# Patient Record
Sex: Female | Born: 1965
Health system: Southern US, Community
[De-identification: ages and names within clinical notes are randomized; demographics above are authoritative.]

## PROBLEM LIST (undated history)

## (undated) DIAGNOSIS — K219 Gastro-esophageal reflux disease without esophagitis: Secondary | ICD-10-CM

## (undated) DIAGNOSIS — B009 Herpesviral infection, unspecified: Secondary | ICD-10-CM

## (undated) DIAGNOSIS — M549 Dorsalgia, unspecified: Secondary | ICD-10-CM

## (undated) DIAGNOSIS — G43909 Migraine, unspecified, not intractable, without status migrainosus: Secondary | ICD-10-CM

## (undated) DIAGNOSIS — E7800 Pure hypercholesterolemia, unspecified: Secondary | ICD-10-CM

## (undated) DIAGNOSIS — E0500 Thyrotoxicosis with diffuse goiter without thyrotoxic crisis or storm: Secondary | ICD-10-CM

## (undated) DIAGNOSIS — K760 Fatty (change of) liver, not elsewhere classified: Secondary | ICD-10-CM

## (undated) DIAGNOSIS — R87619 Unspecified abnormal cytological findings in specimens from cervix uteri: Secondary | ICD-10-CM

## (undated) HISTORY — DX: Migraine, unspecified, not intractable, without status migrainosus: G43.909

## (undated) HISTORY — DX: Dorsalgia, unspecified: M54.9

## (undated) HISTORY — DX: Fatty (change of) liver, not elsewhere classified: K76.0

## (undated) HISTORY — DX: Gastro-esophageal reflux disease without esophagitis: K21.9

## (undated) HISTORY — PX: TUBAL LIGATION: SHX77

## (undated) HISTORY — DX: Herpesviral infection, unspecified: B00.9

## (undated) HISTORY — DX: Pure hypercholesterolemia, unspecified: E78.00

## (undated) HISTORY — PX: AUGMENTATION MAMMAPLASTY: SUR837

## (undated) HISTORY — PX: EYE SURGERY: SHX253

## (undated) HISTORY — DX: Unspecified abnormal cytological findings in specimens from cervix uteri: R87.619

## (undated) HISTORY — DX: Thyrotoxicosis with diffuse goiter without thyrotoxic crisis or storm: E05.00

---

## 1997-07-31 ENCOUNTER — Other Ambulatory Visit: Admission: RE | Admit: 1997-07-31 | Discharge: 1997-07-31 | Payer: Self-pay | Admitting: *Deleted

## 1998-03-04 ENCOUNTER — Other Ambulatory Visit: Admission: RE | Admit: 1998-03-04 | Discharge: 1998-03-04 | Payer: Self-pay | Admitting: *Deleted

## 1998-03-17 ENCOUNTER — Ambulatory Visit (HOSPITAL_COMMUNITY): Admission: RE | Admit: 1998-03-17 | Discharge: 1998-03-17 | Payer: Self-pay | Admitting: *Deleted

## 1998-03-17 ENCOUNTER — Encounter: Payer: Self-pay | Admitting: *Deleted

## 1998-04-07 ENCOUNTER — Encounter (HOSPITAL_COMMUNITY): Admission: RE | Admit: 1998-04-07 | Discharge: 1998-07-06 | Payer: Self-pay | Admitting: *Deleted

## 1998-04-16 ENCOUNTER — Inpatient Hospital Stay (HOSPITAL_COMMUNITY): Admission: AD | Admit: 1998-04-16 | Discharge: 1998-04-16 | Payer: Self-pay | Admitting: Obstetrics & Gynecology

## 1998-05-18 ENCOUNTER — Inpatient Hospital Stay (HOSPITAL_COMMUNITY): Admission: AD | Admit: 1998-05-18 | Discharge: 1998-05-18 | Payer: Self-pay | Admitting: Obstetrics

## 1998-06-30 ENCOUNTER — Inpatient Hospital Stay (HOSPITAL_COMMUNITY): Admission: AD | Admit: 1998-06-30 | Discharge: 1998-07-03 | Payer: Self-pay | Admitting: *Deleted

## 1998-07-07 ENCOUNTER — Encounter (HOSPITAL_COMMUNITY): Admission: RE | Admit: 1998-07-07 | Discharge: 1998-09-25 | Payer: Self-pay | Admitting: *Deleted

## 1998-07-19 ENCOUNTER — Inpatient Hospital Stay (HOSPITAL_COMMUNITY): Admission: AD | Admit: 1998-07-19 | Discharge: 1998-07-19 | Payer: Self-pay | Admitting: Obstetrics & Gynecology

## 1998-07-31 ENCOUNTER — Inpatient Hospital Stay (HOSPITAL_COMMUNITY): Admission: AD | Admit: 1998-07-31 | Discharge: 1998-08-04 | Payer: Self-pay | Admitting: *Deleted

## 1998-09-24 ENCOUNTER — Inpatient Hospital Stay (HOSPITAL_COMMUNITY): Admission: AD | Admit: 1998-09-24 | Discharge: 1998-09-27 | Payer: Self-pay | Admitting: *Deleted

## 1998-09-24 ENCOUNTER — Encounter (INDEPENDENT_AMBULATORY_CARE_PROVIDER_SITE_OTHER): Payer: Self-pay | Admitting: Specialist

## 1998-09-28 ENCOUNTER — Encounter (HOSPITAL_COMMUNITY): Admission: RE | Admit: 1998-09-28 | Discharge: 1998-12-27 | Payer: Self-pay | Admitting: *Deleted

## 1998-11-10 ENCOUNTER — Encounter (INDEPENDENT_AMBULATORY_CARE_PROVIDER_SITE_OTHER): Payer: Self-pay | Admitting: Specialist

## 1998-11-10 ENCOUNTER — Inpatient Hospital Stay (HOSPITAL_COMMUNITY): Admission: AD | Admit: 1998-11-10 | Discharge: 1998-11-10 | Payer: Self-pay | Admitting: *Deleted

## 1999-05-25 ENCOUNTER — Other Ambulatory Visit: Admission: RE | Admit: 1999-05-25 | Discharge: 1999-05-25 | Payer: Self-pay | Admitting: Obstetrics & Gynecology

## 2000-06-07 ENCOUNTER — Other Ambulatory Visit: Admission: RE | Admit: 2000-06-07 | Discharge: 2000-06-07 | Payer: Self-pay | Admitting: Obstetrics & Gynecology

## 2001-07-13 ENCOUNTER — Other Ambulatory Visit: Admission: RE | Admit: 2001-07-13 | Discharge: 2001-07-13 | Payer: Self-pay | Admitting: Obstetrics & Gynecology

## 2002-07-24 ENCOUNTER — Other Ambulatory Visit: Admission: RE | Admit: 2002-07-24 | Discharge: 2002-07-24 | Payer: Self-pay | Admitting: Obstetrics & Gynecology

## 2003-03-21 ENCOUNTER — Emergency Department (HOSPITAL_COMMUNITY): Admission: EM | Admit: 2003-03-21 | Discharge: 2003-03-21 | Payer: Self-pay | Admitting: Emergency Medicine

## 2003-03-21 ENCOUNTER — Emergency Department (HOSPITAL_COMMUNITY): Admission: EM | Admit: 2003-03-21 | Discharge: 2003-03-21 | Payer: Self-pay | Admitting: Internal Medicine

## 2008-11-05 ENCOUNTER — Emergency Department (HOSPITAL_COMMUNITY): Admission: EM | Admit: 2008-11-05 | Discharge: 2008-11-05 | Payer: Self-pay | Admitting: Emergency Medicine

## 2012-05-11 ENCOUNTER — Ambulatory Visit: Payer: BC Managed Care – PPO

## 2012-05-14 ENCOUNTER — Ambulatory Visit (INDEPENDENT_AMBULATORY_CARE_PROVIDER_SITE_OTHER): Payer: BC Managed Care – PPO | Admitting: Family Medicine

## 2012-05-14 ENCOUNTER — Encounter: Payer: Self-pay | Admitting: Radiology

## 2012-05-14 VITALS — BP 114/78 | HR 64 | Temp 98.0°F | Resp 16 | Ht 62.25 in | Wt 146.0 lb

## 2012-05-14 DIAGNOSIS — Z23 Encounter for immunization: Secondary | ICD-10-CM

## 2012-05-14 DIAGNOSIS — Z0000 Encounter for general adult medical examination without abnormal findings: Secondary | ICD-10-CM

## 2012-05-14 DIAGNOSIS — Z7189 Other specified counseling: Secondary | ICD-10-CM

## 2012-05-14 DIAGNOSIS — G43909 Migraine, unspecified, not intractable, without status migrainosus: Secondary | ICD-10-CM

## 2012-05-14 NOTE — Progress Notes (Signed)
  Subjective:    Patient ID: Andrea Walsh, female    DOB: 1965-07-05, 47 y.o.   MRN: [id number]  HPI    Review of Systems     Objective:   Physical Exam     Tuberculosis Risk Questionnaire  1. Were you born outside the [place] in one of the following parts of the world:    [place], [place], [place], [place] or [place]?  No  2. Have you traveled outside the [place] and lived for more than one month in one of the following parts of the world:  [place], [place], [place], [place] or [place]?  No  3. Do you have a compromised immune system such as from any of the following conditions:  HIV/AIDS, organ or bone marrow transplantation, diabetes, immunosuppressive   medicines (e.g. Prednisone, Remicaide), leukemia, lymphoma, cancer of the   head or neck, gastrectomy or jejunal bypass, end-stage renal disease (on   dialysis), or silicosis?  No    4. Have you ever done one of the following:    Used crack cocaine, injected illegal drugs, worked or resided in jail or prison,   worked or resided at a homeless shelter, or worked as a Research scientist (physical sciences) in   direct contact with patients?  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  5. Have you ever been exposed to anyone with infectious tuberculosis?  No   Tuberculosis Symptom Questionnaire  Do you currently have any of the following symptoms?  1. Unexplained cough lasting more than 3 weeks? No  Unexplained fever lasting more than 3 weeks. No   3. Night Sweats (sweating that leaves the bedclothes and sheets wet)   No  4. Shortness of Breath No  5. Chest Pain No  6. Unintentional weight loss  No  7. Unexplained fatigue (very tired for no reason) No     Assessment & Plan:

## 2012-05-14 NOTE — Progress Notes (Addendum)
[facility] [address], [place] [address] [phone]  Date:  05/14/2012   Name:  Andrea Walsh   DOB:  07/02/1965   MRN:  [id number]  PCP:  No primary provider on file.    Chief Complaint: Annual Exam   History of Present Illness:  Andrea Walsh is a 47 y.o. 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 She would like to use this visit as her annual exam, but she does have an OB- GYN and declines additional BW, breast or pelvic exams today.  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    She has no known medical problems except for migraine HA which she might get twice a month.  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  She has 5 children (one set of twins) and is SA with one partner.  She does not smoke or drink or use any drugs, and exercises regularly.    She had all of her childhood immunizations but does not have a record of these.  She does not think she has had the hep B series, and has not had a recent tetanus shot. She did have a flu shot (no record with her at this time) and [facility]  There is no problem list on file for this patient.   No past medical history on file.  Past Surgical History  Procedure Laterality Date  . Tubal ligation      History  Substance Use Topics  . Smoking status: Never Smoker   . Smokeless tobacco: Not on file  . Alcohol Use: No    Family History  Problem Relation Age of Onset  . Cancer Father     lung    Allergies  Allergen Reactions  . Morphine And Related Nausea And Vomiting  . Pyridium (Phenazopyridine Hcl) Rash    Medication list has been reviewed and updated.  No current outpatient prescriptions on  file prior to visit.   No current facility-administered medications on file prior to visit.    Review of Systems:  As per HPI- otherwise negative.   Physical Examination: Filed Vitals:   05/14/12 1236  BP: 114/78  Pulse: 64  Temp: 98 F (36.7 C)  Resp: 16   Filed Vitals:   05/14/12 1236  Height: 5' 2.25" (1.581 m)  Weight: 146 lb (66.225 kg)   Body mass index is 26.49 kg/(m^2). Ideal Body Weight: Weight in (lb) to have BMI = 25: 137.5  GEN: WDWN, NAD, Non-toxic, A & O x 3 HEENT: Atraumatic, Normocephalic. Neck supple. No masses, No LAD.  Bilateral TM wnl, oropharynx normal.  Right pupil is asymmetrical (present since infancy per her report),EOMI.   Ears and Nose: No external deformity. CV: RRR, No M/G/R. No JVD. No thrill. No extra heart sounds. PULM: CTA B, no wheezes, crackles, rhonchi. No retractions. No resp. distress. No accessory muscle use. ABD: S, NT, ND, +BS. No rebound. No HSM. EXTR: No c/c/e NEURO Normal gait. Normal strength and DTR extremities  PSYCH: Normally interactive. Conversant. Not depressed or anxious appearing.  Calm demeanor.    Assessment and Plan: Immunization counseling -  Plan: Tdap vaccine greater than or equal to 7yo IM, Hepatitis B vaccine adult IM, Measles/Mumps/Rubella Immunity, Varicella zoster antibody, IgG, TB Skin Test  Physical exam, annual  Migraine headache  Completed PE form for her school.  Her vision is ok despite her pupil issue.  Gave Tdap, Hep B #1 and placed PPD today, drew titers as above.  She will come back in 48- 72 hours for her TB read and can get a copy of her titers at that time.    Signed Abbe Amsterdam, MD  4/22- called and LMOM.  unfortunately she is not immune to measles.  She needs to have an MMR booster when she comes in for her PPD read.  If I am not there personally suggested that she ask for a PA to review her chart to facilitate her getting this immunization. Otherwise her titers are positive.

## 2012-05-15 LAB — MEASLES/MUMPS/RUBELLA IMMUNITY
Mumps IgG: 65.6 AU/mL — ABNORMAL HIGH (ref <9.00)
Rubella: 3.87 Index — ABNORMAL HIGH (ref <0.90)
Rubeola IgG: <5 AU/mL (ref <25.00)

## 2012-05-15 LAB — VARICELLA ZOSTER ANTIBODY, IGG: Varicella IgG: 1028 Index — ABNORMAL HIGH (ref <135.00)

## 2012-05-16 ENCOUNTER — Ambulatory Visit (INDEPENDENT_AMBULATORY_CARE_PROVIDER_SITE_OTHER): Payer: BC Managed Care – PPO | Admitting: Physician Assistant

## 2012-05-16 VITALS — BP 110/68 | HR 66 | Temp 98.2°F | Resp 16 | Ht 62.25 in | Wt 146.0 lb

## 2012-05-16 DIAGNOSIS — Z23 Encounter for immunization: Secondary | ICD-10-CM

## 2012-05-16 LAB — TB SKIN TEST: TB Skin Test: NEGATIVE

## 2012-05-16 NOTE — Progress Notes (Signed)
  Subjective:    Patient ID: Andrea Walsh, female    DOB: 1965/11/22, 47 y.o.   MRN: [id number]  HPI   Andrea Walsh is a very pleasant 47 yr old female here to complete immunization requirements for school.   She was here two days ago for CPE, see previous note.  At that time a PPD was placed, she needs that read today.  Additionally she had MMR and varicella titers drawn.  Lab results show that she is immune to varicella, mumps, and rubella.  She is NOT immune to measles.  School requires either two documented MMR vaccines or proof of immunity.  Pt knows she received the vaccines as a child but has no documentation.    Review of Systems  All other systems reviewed and are negative.       Objective:   Physical Exam  Vitals reviewed. Constitutional: She is oriented to person, place, and time. She appears well-developed and well-nourished. No distress.  HENT:  Head: Normocephalic and atraumatic.  Eyes: Conjunctivae are normal. No scleral icterus.  Pulmonary/Chest: Effort normal.  Neurological: She is alert and oriented to person, place, and time.  Skin: Skin is warm and dry.  Psychiatric: She has a normal mood and affect. Her behavior is normal.     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   PPD negative.  She is immune to mumps, rubella, and varicella.  She is NOT immune to measles.  She does not have any documented MMR vaccine administrations.  Since her program requires either two MMR administrations or documented immunity, I think her only option is to repeat the MMR vaccines.  First dose given today.  Future order placed to receive second dose in 1 month.  Pt will also be returning then for 2nd hep b.   Paperwork updated today.

## 2012-06-13 ENCOUNTER — Ambulatory Visit (INDEPENDENT_AMBULATORY_CARE_PROVIDER_SITE_OTHER): Payer: BC Managed Care – PPO | Admitting: Physician Assistant

## 2012-06-13 VITALS — BP 100/72 | HR 60 | Temp 98.3°F | Resp 16 | Ht 62.0 in | Wt 144.6 lb

## 2012-06-13 DIAGNOSIS — Z23 Encounter for immunization: Secondary | ICD-10-CM

## 2012-06-13 NOTE — Progress Notes (Signed)
  Subjective:    Patient ID: Andrea Walsh, female    DOB: 02-11-1965, 47 y.o.   MRN: [id number]  HPI 47 year old female presents for immunizations.  Needs Hep B #2 and MMMR #2.  No other concerns today.     Review of Systems  Constitutional: Negative for fever and chills.  Gastrointestinal: Negative for nausea and vomiting.  Neurological: Negative for headaches.       Objective:   Physical Exam  Constitutional: She is oriented to person, place, and time. She appears well-developed and well-nourished.  HENT:  Head: Normocephalic and atraumatic.  Right Ear: External ear normal.  Left Ear: External ear normal.  Eyes: Conjunctivae are normal.  Neck: Normal range of motion.  Cardiovascular: Normal rate.   Pulmonary/Chest: Effort normal.  Neurological: She is alert and oriented to person, place, and time.  Psychiatric: She has a normal mood and affect. Her behavior is normal. Judgment and thought content normal.          Assessment & Plan:  Need for prophylactic vaccination and inoculation against viral hepatitis - Plan: Hepatitis B vaccine adult IM  Need for MMR vaccine  Need for immunization against measles - Plan: MMR vaccine subcutaneous Hep B #2 and MMR #2 given today Return for Hep B #3

## 2012-11-13 ENCOUNTER — Ambulatory Visit (INDEPENDENT_AMBULATORY_CARE_PROVIDER_SITE_OTHER): Payer: BC Managed Care – PPO | Admitting: Radiology

## 2012-11-13 DIAGNOSIS — Z23 Encounter for immunization: Secondary | ICD-10-CM

## 2012-11-13 NOTE — Addendum Note (Signed)
Addended byLevon Hedger A on: 11/13/2012 02:27 PM   Modules accepted: Level of Service

## 2014-11-29 ENCOUNTER — Encounter (HOSPITAL_COMMUNITY): Payer: Self-pay

## 2014-11-29 ENCOUNTER — Emergency Department (HOSPITAL_COMMUNITY)
Admission: EM | Admit: 2014-11-29 | Discharge: 2014-11-29 | Disposition: A | Discharge status: Home / Self Care | Payer: 59 | Attending: Emergency Medicine | Admitting: Emergency Medicine

## 2014-11-29 DIAGNOSIS — M545 Low back pain, unspecified: Secondary | ICD-10-CM

## 2014-11-29 DIAGNOSIS — Z79899 Other long term (current) drug therapy: Secondary | ICD-10-CM | POA: Diagnosis not present

## 2014-11-29 DIAGNOSIS — Z7951 Long term (current) use of inhaled steroids: Secondary | ICD-10-CM | POA: Diagnosis not present

## 2014-11-29 MED ORDER — HYDROCODONE-ACETAMINOPHEN 5-325 MG PO TABS: 2.0000 | ORAL_TABLET | ORAL | Status: DC | PRN

## 2014-11-29 MED ORDER — METHOCARBAMOL 500 MG PO TABS: 500.0000 mg | ORAL_TABLET | Freq: Two times a day (BID) | ORAL | Status: DC | PRN

## 2014-11-29 MED ORDER — METHOCARBAMOL 500 MG PO TABS
500.0000 mg | ORAL_TABLET | Freq: Once | ORAL | Status: AC
  Administered 2014-11-29: 500 mg via ORAL
  Filled 2014-11-29: qty 1

## 2014-11-29 MED ORDER — KETOROLAC TROMETHAMINE 60 MG/2ML IM SOLN
60.0000 mg | Freq: Once | INTRAMUSCULAR | Status: AC
  Administered 2014-11-29: 60 mg via INTRAMUSCULAR
  Filled 2014-11-29: qty 2

## 2014-11-29 MED ORDER — IBUPROFEN 800 MG PO TABS: 800.0000 mg | ORAL_TABLET | Freq: Three times a day (TID) | ORAL | Status: DC

## 2014-11-29 NOTE — ED Notes (Signed)
She c/o "incapacitating" low back pain/spasm. She cites "working out at [employer]" this Wed.  She is in no distress and is a bit tearful as I interview her.  She denies any paresthesias/dysthesias of lower extremities.

## 2014-11-29 NOTE — ED Provider Notes (Signed)
CSN: [id number]     Arrival date & time 11/29/14  8416 History   First MD Initiated Contact with Patient 11/29/14 [phone]     Chief Complaint  Patient presents with  . Back Pain     (Consider location/radiation/quality/duration/timing/severity/associated sxs/prior Treatment) HPI  The patient is a 49 year old female, she works as an Hotel manager, states that once a year she has a flare up of her back pain - states that on Wednesday after working out particularly hard, she had R lower back pain that has been persistent, worse with movement and does not radiate - no red flags for pathological back pain.  She has increased pain since last night and she feels as though the pain is spasming.  History reviewed. No pertinent past medical history. Past Surgical History  Procedure Laterality Date  . Tubal ligation     Family History  Problem Relation Age of Onset  . Cancer Father     lung   Social History  Substance Use Topics  . Smoking status: Never Smoker   . Smokeless tobacco: None  . Alcohol Use: No   OB History    No data available     Review of Systems  Constitutional: Negative for fever and chills.  Cardiovascular: Negative for leg swelling.  Gastrointestinal: Negative for nausea and vomiting.       No incontinence of bowel  Genitourinary: Negative for difficulty urinating.       No incontinence or retention  Musculoskeletal: Positive for back pain. Negative for neck pain.  Skin: Negative for rash.  Neurological: Negative for weakness and numbness.      Allergies  Morphine and related and Pyridium  Home Medications   Prior to Admission medications   Medication Sig Start Date End Date Taking? Authorizing Provider  fluticasone (FLONASE) 50 MCG/ACT nasal spray Place 2 sprays into the nose daily.    Historical Provider, MD  HYDROcodone-acetaminophen (NORCO/VICODIN) 5-325 MG tablet Take 2 tablets by mouth every 4 (four) hours as needed. 11/29/14   Noemi Chapel, MD   ibuprofen (ADVIL,MOTRIN) 800 MG tablet Take 1 tablet (800 mg total) by mouth 3 (three) times daily. 11/29/14   Noemi Chapel, MD  methocarbamol (ROBAXIN) 500 MG tablet Take 1 tablet (500 mg total) by mouth 2 (two) times daily as needed for muscle spasms. 11/29/14   Noemi Chapel, MD  Multiple Vitamin (MULTIVITAMIN) tablet Take 1 tablet by mouth daily.    Historical Provider, MD  zolmitriptan (ZOMIG) 5 MG tablet Take 5 mg by mouth as needed for migraine.    Historical Provider, MD   BP 165/99 mmHg  Pulse 101  Temp(Src) 97.6 F (36.4 C) (Oral)  Resp 17  SpO2 99%  LMP 11/08/2014 (Approximate) Physical Exam  Constitutional: She appears well-developed and well-nourished. No distress.  HENT:  Head: Normocephalic and atraumatic.  Eyes: Conjunctivae are normal. Right eye exhibits no discharge. Left eye exhibits no discharge. No scleral icterus.  Cardiovascular: Normal rate and regular rhythm.   Pulmonary/Chest: Effort normal and breath sounds normal.  Musculoskeletal: She exhibits no edema.  Tenderness of the back over the R lateral lumba]r muscles No tenderness over the Cervical, Thoracic or Lumbar Spine  Neurological:  Speech is clear, strength in the UE and LE's are normal at the major muscle groups including the hip, knee and ankles.  Sensation in tact to light touch and pin prick of the bilateral LE's.  Normal reflexes at the knees bilaterally.  Gait antalgic secondary to pain  Skin: Skin is warm and dry. No rash noted. She is not diaphoretic.    ED Course  Procedures (including critical care time) Labs Review Labs Reviewed - No data to display  Imaging Review No results found. I have personally reviewed and evaluated these images and lab results as part of my medical decision-making.   MDM   Final diagnoses:  Right-sided low back pain without sciatica    The patient is having ongoing back pain, this seems to be worse with movement, consistent with muscle spasm and strain of  the right lower back, Toradol, Robaxin, close follow-up, the patient is in agreement with the plan.  No fevers, IVDU, Ca, urinary sx.  Meds given in ED:  Medications  ketorolac (TORADOL) injection 60 mg (not administered)  methocarbamol (ROBAXIN) tablet 500 mg (not administered)    New Prescriptions   HYDROCODONE-ACETAMINOPHEN (NORCO/VICODIN) 5-325 MG TABLET    Take 2 tablets by mouth every 4 (four) hours as needed.   IBUPROFEN (ADVIL,MOTRIN) 800 MG TABLET    Take 1 tablet (800 mg total) by mouth 3 (three) times daily.   METHOCARBAMOL (ROBAXIN) 500 MG TABLET    Take 1 tablet (500 mg total) by mouth 2 (two) times daily as needed for muscle spasms.        Noemi Chapel, MD 11/29/14 [phone]

## 2014-11-29 NOTE — Discharge Instructions (Signed)
Back Pain: ° ° °Your back pain should be treated with medicines such as ibuprofen or aleve and this back pain should get better over the next 2 weeks.  However if you develop severe or worsening pain, low back pain with fever, numbness, weakness or inability to walk or urinate, you should return to the ER immediately.  Please follow up with your doctor this week for a recheck if still having symptoms. °Low back pain is discomfort in the lower back that may be due to injuries to muscles and ligaments around the spine.  Occasionally, it may be caused by a a problem to a part of the spine called a disc.  The pain may last several days or a week;  However, most patients get completely well in 4 weeks. ° °Self - care:  The application of heat can help soothe the pain.  Maintaining your daily activities, including walking, is encourged, as it will help you get better faster than just staying in bed. ° °Medications are also useful to help with pain control.  A commonly prescribed medications includes acetaminophen.  This medication is generally safe, though you should not take more than 8 of the extra strength (500mg) pills a day. ° °Non steroidal anti inflammatory medications including Ibuprofen and naproxen;  These medications help both pain and swelling and are very useful in treating back pain.  They should be taken with food, as they can cause stomach upset, and more seriously, stomach bleeding.   ° °Muscle relaxants:  These medications can help with muscle tightness that is a cause of lower back pain.  Most of these medications can cause drowsiness, and it is not safe to drive or use dangerous machinery while taking them. ° °You will need to follow up with  Your primary healthcare provider in 1-2 weeks for reassessment. ° °Be aware that if you develop new symptoms, such as a fever, leg weakness, difficulty with or loss of control of your urine or bowels, abdominal pain, or more severe pain, you will need to seek  medical attention and  / or return to the Emergency department. ° °If you do not have a doctor see the list below. ° °RESOURCE GUIDE ° °Chronic Pain Problems: °Contact [facility]  [phone] °Patients need to be referred by their primary care doctor. ° °Insufficient Money for Medicine: °Contact United Way:  call "211" or [facility] 271-5999. ° °No Primary Care Doctor: °- Call [facility]  832-8000 - can help you locate a primary care doctor that  accepts your insurance, provides certain services, etc. °- Physician Referral Service- 1-800-533-3463 ° °Agencies that provide inexpensive medical care: °- [facility]  [phone] °- [facility]  832-7272 °- [facility]  [phone] °- [facility]  832-4777 °- [facility]  [phone] °- [facility]  [phone] ° °Medicaid-accepting [place] Providers: °- [facility]- [address] ° [phone], Mon-Fri 9am-7pm, Sat 9am-1pm °- [facility]- [address], [address] ° [phone] °- [facility]- [address], [address] ° [phone] °- [facility]- [address] [place] [address] ° [phone] °- [facility]- [address], [address], [phone] ° Only accepts [place] Access Medicaid patients after they have their name  applied to their card ° °Self Pay (no insurance) in [place]: °- Sickle Cell Patients: Dr Eric Dean, [facility] °   [address], [phone] °- [facility]- [address] ° [phone] °      -     [facility]- [address] °      -     [facility]- see information above (Speak to Pam H if you do not have insurance) °      -  [facility]- [address], [phone] °      -  [facility] [place]- [address],  [phone] °      -  [facility]- [address] [place] [address],  [phone] °      -  Dr Osei-Bonsu-  [address], [place], [phone] °      -  [facility]- [address], [phone] °      -  [facility]- [address] [place] [address], [phone], also [address], [phone] °      -    [facility]- [address], [phone], 1st & 3rd Saturday   every month, 10am-1pm ° °1) Find a Doctor and Pay Out of Pocket °Although you won't have to find out who is covered by your insurance plan, it is a good idea to ask around and get recommendations. You will then need to call the office and see if the doctor you have chosen will accept you as a new patient and what types of options they offer for patients who are self-pay. Some doctors offer discounts or will set up payment plans for their patients who do not have insurance, but you will need to ask so you aren't surprised when you get to your appointment. ° °2) Contact Your Local Health Department °Not all health departments have doctors that can see patients for sick visits, but many do, so it is worth a call to see if yours does. If you don't know where your local health department is, you can check in your phone book. The CDC also has a tool to help you locate your state's health department, and many state websites also have listings of all of their local health departments. ° °3) Find a Walk-in Clinic °If your illness is not likely to be very severe or complicated, you may want to try a walk in clinic. These are popping up all over the country in pharmacies, drugstores, and shopping centers. They're usually staffed by nurse practitioners or physician assistants that have been trained to treat common illnesses and complaints. They're usually fairly quick and inexpensive. However, if you have serious medical issues or chronic medical problems, these are probably not your best option ° °STD Testing °- [place] [facility] [place], [facility] Clinic, [address], [place], phone [phone] or 1-877-539-9860.  Monday - Friday, call for an appointment. °- [place] [facility] [place], [facility], [address], [place], phone [phone] or 1-877-539-9860.  Monday - Friday, call for an appointment. ° °Abuse/Neglect: °- [place] Child Abuse Hotline (336) 641-3795 °- [place] Child Abuse Hotline 800-378-5315 (After Hours) ° °Emergency Shelter:  [facility] (336) 271-5985 ° °Maternity Homes: °- [facility] (336) 275-9566 °- [facility] (704) 372-4663 ° °MRSA Hotline #:   832-7006 ° °[place] Resources ° °[facility] [place]  [employer] [place] [facility] °[address]                 [address]         [address]  °[place]                                               [place]                              [place] °Phone:  349-3220                                  Phone:  342-7768                   Phone:  342-8140 ° °[facility], 342-8316 °- [place] [facility] - [facility]- 1-888-581-9988 °      -     [facility] in [place], [address],                                  336-349-4454, Insurance ° °[place] Child Abuse Hotline °(336) 342-1394 or (336) 342-3537 (After Hours) ° ° °Behavioral Health Services ° °Substance Abuse Resources: °- [facility]  336-882-2125 °- [facility] 336-784-9470 °- [facility] 336-285-9073 °- [facility] 336-845-3988 °- Residential & Outpatient Substance Abuse Program  800-659-3381 ° °Psychological Services: °- [facility]  832-9600 °- [facility]  378-7881 °- [facility], [address], [place], ACCESS LINE: 1-800-853-5163 or 336-641-4981, Http://www.[facility]/services/adult.htm ° °Dental Assistance ° °If unable to pay or  uninsured, contact:  [facility] or [place] [facility] to become qualified for the adult dental clinic. ° °Patients with Medicaid: [facility] °[address], [phone] °[address], [phone] ° °If unable to pay, or uninsured, contact [facility] (271-5999) or [place] [facility] (641-3152 in [place], 842-7733 in [place]) to become qualified for the adult dental clinic ° °Other Low-Cost Community Dental Services: °- [facility]- [address], [place], [place], [address], 723-1848, [address]. [address], 2nd and 4th Thursday of the month at 6:30am.  10 clients each day by appointment, can sometimes see walk-in patients if someone does not show for an appointment. °- [facility]- [address], [place], [place], [address], 723-7904 °- [facility]- [address], [place], [place], [address], 631-2330 °- [facility] [place] [facility]- 342-8273 °- [facility]- 703-3100 °- [facility]- 570-6415 ° ° ° ° ° ° °

## 2015-02-11 MED FILL — ZOMIG 5 MG NASAL SPRAY: 5 | 90 days supply | Qty: 24 | Fill #1

## 2015-05-12 DIAGNOSIS — R232 Flushing: Secondary | ICD-10-CM | POA: Diagnosis not present

## 2015-05-12 DIAGNOSIS — R21 Rash and other nonspecific skin eruption: Secondary | ICD-10-CM | POA: Diagnosis not present

## 2015-05-12 MED FILL — predniSONE 10 MG (48) TBPK: 10 | 12 days supply | Qty: 48 | Fill #0

## 2015-05-26 DIAGNOSIS — E0590 Thyrotoxicosis, unspecified without thyrotoxic crisis or storm: Secondary | ICD-10-CM | POA: Diagnosis not present

## 2015-05-27 ENCOUNTER — Other Ambulatory Visit (HOSPITAL_COMMUNITY): Payer: Self-pay | Admitting: Family Medicine

## 2015-05-27 DIAGNOSIS — E0590 Thyrotoxicosis, unspecified without thyrotoxic crisis or storm: Secondary | ICD-10-CM

## 2015-06-04 ENCOUNTER — Encounter (HOSPITAL_COMMUNITY)
Admission: RE | Admit: 2015-06-04 | Discharge: 2015-06-04 | Disposition: A | Discharge status: Home / Self Care | Payer: 59 | Source: Ambulatory Visit | Attending: Family Medicine | Admitting: Family Medicine

## 2015-06-04 DIAGNOSIS — E0590 Thyrotoxicosis, unspecified without thyrotoxic crisis or storm: Secondary | ICD-10-CM | POA: Insufficient documentation

## 2015-06-05 ENCOUNTER — Encounter (HOSPITAL_COMMUNITY)
Admission: RE | Admit: 2015-06-05 | Discharge: 2015-06-05 | Disposition: A | Discharge status: Home / Self Care | Payer: 59 | Source: Ambulatory Visit | Attending: Family Medicine | Admitting: Family Medicine

## 2015-06-05 DIAGNOSIS — E0590 Thyrotoxicosis, unspecified without thyrotoxic crisis or storm: Secondary | ICD-10-CM | POA: Diagnosis not present

## 2015-06-05 MED ORDER — SODIUM PERTECHNETATE TC 99M INJECTION
10.0000 | Freq: Once | INTRAVENOUS | Status: AC | PRN
  Administered 2015-06-05: 10 via INTRAVENOUS

## 2015-06-05 MED ORDER — SODIUM IODIDE I 131 CAPSULE
10.0000 | Freq: Once | INTRAVENOUS | Status: AC | PRN
  Administered 2015-06-04: 10 via ORAL

## 2015-06-24 DIAGNOSIS — E0590 Thyrotoxicosis, unspecified without thyrotoxic crisis or storm: Secondary | ICD-10-CM | POA: Diagnosis not present

## 2015-06-29 DIAGNOSIS — Z6828 Body mass index (BMI) 28.0-28.9, adult: Secondary | ICD-10-CM | POA: Diagnosis not present

## 2015-06-29 DIAGNOSIS — Z1151 Encounter for screening for human papillomavirus (HPV): Secondary | ICD-10-CM | POA: Diagnosis not present

## 2015-06-29 DIAGNOSIS — Z1231 Encounter for screening mammogram for malignant neoplasm of breast: Secondary | ICD-10-CM | POA: Diagnosis not present

## 2015-06-29 DIAGNOSIS — Z01419 Encounter for gynecological examination (general) (routine) without abnormal findings: Secondary | ICD-10-CM | POA: Diagnosis not present

## 2015-07-06 MED FILL — ZOMIG 5 MG NASAL SPRAY: 5 | 90 days supply | Qty: 24 | Fill #0

## 2015-07-09 MED FILL — methIMAzole 10 MG TABS: 10 | 90 days supply | Qty: 90 | Fill #0

## 2015-07-13 DIAGNOSIS — G43009 Migraine without aura, not intractable, without status migrainosus: Secondary | ICD-10-CM | POA: Diagnosis not present

## 2015-07-13 DIAGNOSIS — G479 Sleep disorder, unspecified: Secondary | ICD-10-CM | POA: Diagnosis not present

## 2015-07-22 MED FILL — ALPRAZolam 0.25 MG TABS: 0.25 | 15 days supply | Qty: 30 | Fill #0

## 2015-08-22 DIAGNOSIS — S50811A Abrasion of right forearm, initial encounter: Secondary | ICD-10-CM | POA: Diagnosis not present

## 2015-08-22 DIAGNOSIS — L309 Dermatitis, unspecified: Secondary | ICD-10-CM | POA: Diagnosis not present

## 2015-08-22 DIAGNOSIS — S50812A Abrasion of left forearm, initial encounter: Secondary | ICD-10-CM | POA: Diagnosis not present

## 2015-09-15 DIAGNOSIS — Z79899 Other long term (current) drug therapy: Secondary | ICD-10-CM | POA: Diagnosis not present

## 2015-09-15 DIAGNOSIS — E0590 Thyrotoxicosis, unspecified without thyrotoxic crisis or storm: Secondary | ICD-10-CM | POA: Diagnosis not present

## 2015-09-15 DIAGNOSIS — E0520 Thyrotoxicosis with toxic multinodular goiter without thyrotoxic crisis or storm: Secondary | ICD-10-CM | POA: Diagnosis not present

## 2015-09-21 DIAGNOSIS — E0500 Thyrotoxicosis with diffuse goiter without thyrotoxic crisis or storm: Secondary | ICD-10-CM | POA: Diagnosis not present

## 2015-10-15 MED FILL — methIMAzole 5 MG TABS: 5 | 90 days supply | Qty: 90 | Fill #0

## 2015-11-26 MED FILL — ZOMIG 5 MG NASAL SPRAY: 5 | 90 days supply | Qty: 24 | Fill #0

## 2015-12-22 DIAGNOSIS — E0500 Thyrotoxicosis with diffuse goiter without thyrotoxic crisis or storm: Secondary | ICD-10-CM | POA: Diagnosis not present

## 2016-01-04 DIAGNOSIS — R232 Flushing: Secondary | ICD-10-CM | POA: Diagnosis not present

## 2016-01-04 MED FILL — PARoxetine HCL 10 MG TABS: 10 | 30 days supply | Qty: 30 | Fill #0

## 2016-01-12 DIAGNOSIS — G479 Sleep disorder, unspecified: Secondary | ICD-10-CM | POA: Diagnosis not present

## 2016-01-12 DIAGNOSIS — M62838 Other muscle spasm: Secondary | ICD-10-CM | POA: Diagnosis not present

## 2016-01-12 DIAGNOSIS — G43009 Migraine without aura, not intractable, without status migrainosus: Secondary | ICD-10-CM | POA: Diagnosis not present

## 2016-01-12 MED FILL — METHOCARBAMOL 500 MG TABLET: 500 | 30 days supply | Qty: 60 | Fill #0 | Status: TO

## 2016-01-15 MED FILL — methIMAzole 5 MG TABS: 5 | 90 days supply | Qty: 90 | Fill #1

## 2016-01-15 MED FILL — ALPRAZolam 0.25 MG TABS: 0.25 | 30 days supply | Qty: 30 | Fill #0

## 2016-01-29 MED FILL — PARoxetine HCL 10 MG TABS: 10 | 30 days supply | Qty: 30 | Fill #1

## 2016-02-23 DIAGNOSIS — R232 Flushing: Secondary | ICD-10-CM | POA: Diagnosis not present

## 2016-02-23 DIAGNOSIS — G479 Sleep disorder, unspecified: Secondary | ICD-10-CM | POA: Diagnosis not present

## 2016-02-23 MED FILL — PARoxetine HCL 10 MG TABS: 10 | 90 days supply | Qty: 90 | Fill #0

## 2016-03-03 MED FILL — ZOMIG 5 MG NASAL SPRAY: 5 | 90 days supply | Qty: 24 | Fill #1

## 2016-03-08 DIAGNOSIS — E0500 Thyrotoxicosis with diffuse goiter without thyrotoxic crisis or storm: Secondary | ICD-10-CM | POA: Diagnosis not present

## 2016-03-16 DIAGNOSIS — R21 Rash and other nonspecific skin eruption: Secondary | ICD-10-CM | POA: Diagnosis not present

## 2016-03-16 DIAGNOSIS — E0500 Thyrotoxicosis with diffuse goiter without thyrotoxic crisis or storm: Secondary | ICD-10-CM | POA: Diagnosis not present

## 2016-04-08 MED FILL — methIMAzole 5 MG TABS: 5 | 30 days supply | Qty: 30 | Fill #2

## 2016-04-13 DIAGNOSIS — I872 Venous insufficiency (chronic) (peripheral): Secondary | ICD-10-CM | POA: Diagnosis not present

## 2016-04-13 DIAGNOSIS — L2081 Atopic neurodermatitis: Secondary | ICD-10-CM | POA: Diagnosis not present

## 2016-04-13 DIAGNOSIS — D229 Melanocytic nevi, unspecified: Secondary | ICD-10-CM | POA: Diagnosis not present

## 2016-04-13 MED FILL — CLOBETASOL 0.05% OINTMENT: 0.05 | 30 days supply | Qty: 60 | Fill #0

## 2016-05-12 MED FILL — methIMAzole 5 MG TABS: 5 | 30 days supply | Qty: 30 | Fill #0

## 2016-06-03 MED FILL — PARoxetine HCL 10 MG TABS: 10 | 90 days supply | Qty: 90 | Fill #1

## 2016-06-10 MED FILL — ZOMIG 5 MG NASAL SPRAY: 5 | 90 days supply | Qty: 24 | Fill #0

## 2016-06-16 MED FILL — methIMAzole 5 MG TABS: 5 | 30 days supply | Qty: 30 | Fill #1

## 2016-07-12 MED FILL — methIMAzole 5 MG TABS: 5 | 30 days supply | Qty: 30 | Fill #2

## 2016-07-21 DIAGNOSIS — R232 Flushing: Secondary | ICD-10-CM | POA: Diagnosis not present

## 2016-07-26 MED FILL — AMOXICILLIN 250 MG CAPSULE: 250 | 10 days supply | Qty: 40 | Fill #0

## 2016-07-26 MED FILL — ALPRAZolam 0.25 MG TABS: 0.25 | 30 days supply | Qty: 30 | Fill #0

## 2016-08-11 MED FILL — methIMAzole 5 MG TABS: 5 | 30 days supply | Qty: 30 | Fill #3

## 2016-09-08 MED FILL — methIMAzole 5 MG TABS: 5 | 30 days supply | Qty: 30 | Fill #4

## 2016-09-12 MED FILL — ZOMIG 5 MG NASAL SPRAY: 5 | 90 days supply | Qty: 24 | Fill #1

## 2016-09-28 MED FILL — PARoxetine HCL 10 MG TABS: 10 | 90 days supply | Qty: 90 | Fill #2

## 2016-10-05 DIAGNOSIS — E0500 Thyrotoxicosis with diffuse goiter without thyrotoxic crisis or storm: Secondary | ICD-10-CM | POA: Diagnosis not present

## 2016-10-10 MED FILL — methIMAzole 5 MG TABS: 5 | 30 days supply | Qty: 30 | Fill #5

## 2016-11-08 MED FILL — methIMAzole 5 MG TABS: 5 | 30 days supply | Qty: 30 | Fill #0

## 2016-12-05 MED FILL — methIMAzole 5 MG TABS: 5 | 30 days supply | Qty: 30 | Fill #1

## 2016-12-13 DIAGNOSIS — H52223 Regular astigmatism, bilateral: Secondary | ICD-10-CM | POA: Diagnosis not present

## 2016-12-13 DIAGNOSIS — H524 Presbyopia: Secondary | ICD-10-CM | POA: Diagnosis not present

## 2016-12-13 DIAGNOSIS — H5203 Hypermetropia, bilateral: Secondary | ICD-10-CM | POA: Diagnosis not present

## 2017-01-05 MED FILL — methIMAzole 5 MG TABS: 5 | 30 days supply | Qty: 30 | Fill #2

## 2017-01-12 DIAGNOSIS — G43009 Migraine without aura, not intractable, without status migrainosus: Secondary | ICD-10-CM | POA: Diagnosis not present

## 2017-01-12 MED FILL — ALPRAZolam 0.25 MG TABS: 0.25 | 30 days supply | Qty: 30 | Fill #0

## 2017-02-09 MED FILL — methIMAzole 5 MG TABS: 5 | 30 days supply | Qty: 30 | Fill #3

## 2017-02-13 MED FILL — ZOMIG 5 MG NASAL SPRAY: 5 | 90 days supply | Qty: 24 | Fill #0

## 2017-02-20 MED FILL — ALPRAZolam 0.25 MG TABS: 0.25 | 30 days supply | Qty: 30 | Fill #1

## 2017-03-09 DIAGNOSIS — E0500 Thyrotoxicosis with diffuse goiter without thyrotoxic crisis or storm: Secondary | ICD-10-CM | POA: Diagnosis not present

## 2017-03-14 MED FILL — methIMAzole 5 MG TABS: 5 | 30 days supply | Qty: 30 | Fill #4

## 2017-03-16 DIAGNOSIS — E0500 Thyrotoxicosis with diffuse goiter without thyrotoxic crisis or storm: Secondary | ICD-10-CM | POA: Diagnosis not present

## 2017-04-20 MED FILL — ALPRAZolam 0.25 MG TABS: 0.25 | 30 days supply | Qty: 30 | Fill #2

## 2017-05-04 MED FILL — methIMAzole 5 MG TABS: 5 | 30 days supply | Qty: 30 | Fill #5

## 2017-05-29 MED FILL — ZOMIG 5 MG NASAL SPRAY: 5 | 90 days supply | Qty: 24 | Fill #1

## 2017-06-21 ENCOUNTER — Encounter: Payer: Self-pay | Admitting: Obstetrics & Gynecology

## 2017-06-21 ENCOUNTER — Ambulatory Visit: Payer: 59 | Admitting: Obstetrics & Gynecology

## 2017-06-21 VITALS — BP 124/80

## 2017-06-21 DIAGNOSIS — N951 Menopausal and female climacteric states: Secondary | ICD-10-CM

## 2017-06-21 DIAGNOSIS — E0500 Thyrotoxicosis with diffuse goiter without thyrotoxic crisis or storm: Secondary | ICD-10-CM | POA: Diagnosis not present

## 2017-06-21 NOTE — Patient Instructions (Signed)
1. Menopause syndrome Oligomenorrhea for the last 2 years with vasomotor menopausal symptoms for a year, worsening in the last 2 months.  Probably entering menopause now.  Will verify [facility] today.  If not in the menopausal range, will consider birth control pills to control the cycle and give some relief of the menopausal symptoms.  If confirmed menopause with a high FSH, will start on hormone replacement therapy.  Estradiol patch 0.05 with Prometrium 100 mg p.o. at bedtime discussed with patient and will be prescribed in that case.  Usage, benefits and risks of hormone replacement therapy thoroughly reviewed.  Benefits of controlling the vasomotor symptoms, improving sleep and tiredness, as well as slowing down bone loss reviewed with patient.  Risk of blood clots including pulmonary embolism and stroke discussed and patient was explained that those risks will increase with age/time, as well as a small increased risk of breast cancer after 10 years of use. - [facility]  Follow-up annual gynecologic visit.  Lexianna, it was a pleasure seeing you today!  I will inform you of your results as soon as they are available.

## 2017-06-21 NOTE — Progress Notes (Signed)
    Andrea Walsh 26-Sep-1965 [id number]        52 y.o.  G4P4L5 Married. S/P TL.  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  29 yo son is back living at home.  Has 1 grand-child from her daughter living in the [place].  Twins starting College next August with a full Lacrosse scholarship!    RP: Severe hot flushes and night sweats increasing in last 2 months  HPI: Oligomenorrhea x 2 years every 2-6 months with normal flow. LMP 03/2017 normal flow.  Hot flushes and night sweats x 1 year, with severe symptoms x 2 months.  Difficulty sleeping, tired, decreased memory.  Has migraines.  Gained about 20 Lbs.  Exercising regularly.  Non-smoker.  No Family h/o CVD or Breast Ca.     OB History  Gravida Para Term Preterm AB Living  4 4       5   SAB TAB Ectopic Multiple Live Births        1      # Outcome Date GA Lbr Len/2nd Weight Sex Delivery Anes PTL Lv  4 Para           3 Para           2 Para           1 Para             Past medical history,surgical history, problem list, medications, allergies, family history and social history were all reviewed and documented in the EPIC chart.   Directed ROS with pertinent positives and negatives documented in the history of present illness/assessment and plan.  Exam:  Vitals:   06/21/17 1227  BP: 124/80   General appearance:  Normal  Deferred to Annual/Gyn visit 08/2017   Assessment/Plan:  52 y.o. G4P4   1. Menopause syndrome Oligomenorrhea for the last 2 years with vasomotor menopausal symptoms for a year, worsening in the last 2 months.  Probably entering menopause now.  Will verify [facility] today.  If not in the menopausal range, will consider birth control pills to control the cycle and give some relief of the menopausal symptoms.  If confirmed menopause with a high FSH, will start on hormone replacement therapy.  Estradiol patch 0.05 with Prometrium 100 mg p.o. at bedtime discussed with patient and will be prescribed  in that case.  Usage, benefits and risks of hormone replacement therapy thoroughly reviewed.  Benefits of controlling the vasomotor symptoms, improving sleep and tiredness, as well as slowing down bone loss reviewed with patient.  Risk of blood clots including pulmonary embolism and stroke discussed and patient was explained that those risks will increase with age/time, as well as a small increased risk of breast cancer after 10 years of use. - [facility]  Follow-up annual gynecologic visit.  Counseling on the above issues and coordination of care more than 50% for 25 minutes.  Andrea Bruins MD, 12:30 PM 06/21/2017

## 2017-06-22 ENCOUNTER — Other Ambulatory Visit: Payer: Self-pay | Admitting: Obstetrics & Gynecology

## 2017-06-22 ENCOUNTER — Telehealth: Payer: Self-pay | Admitting: *Deleted

## 2017-06-22 DIAGNOSIS — Z1231 Encounter for screening mammogram for malignant neoplasm of breast: Secondary | ICD-10-CM

## 2017-06-22 LAB — FOLLICLE STIMULATING HORMONE: FSH: 63.8 m[IU]/mL

## 2017-06-22 MED ORDER — PROGESTERONE MICRONIZED 100 MG PO CAPS: 100.0000 mg | ORAL_CAPSULE | Freq: Every day | ORAL | 4 refills | Status: DC

## 2017-06-22 MED ORDER — ESTRADIOL 0.05 MG/24HR TD PTWK: 0.0500 mg | MEDICATED_PATCH | TRANSDERMAL | 4 refills | Status: DC

## 2017-06-22 MED FILL — ESTRADIOL 0.05 MG/DAY PATCH: 0.05 | 84 days supply | Qty: 12 | Fill #0

## 2017-06-22 MED FILL — PROGESTERONE 100 MG CAPSULE: 100 | 90 days supply | Qty: 90 | Fill #0

## 2017-06-22 NOTE — Telephone Encounter (Signed)
-----   Message from [name], MD sent at 06/21/2017 12:57 PM EDT ----- Regarding: Needs to schedule screening Mammo Patient has silicone breast implants and has been reluctant to have screening Mammo, but very important to do it asap, before I start her on BCPs or HRT.  I meant to discuss this with patient, but thought of it only when she had left already...  Thanks

## 2017-06-22 NOTE — Telephone Encounter (Signed)
Patient informed with the below note, last mammogram done in 2017 at [place] copy will be faxed to scan in chart. I told patient she will need to have a recent screening mammogram prior to starting on the below. Number given to breast center to call and schedule.

## 2017-07-04 MED FILL — methIMAzole 5 MG TABS: 5 | 30 days supply | Qty: 30 | Fill #0

## 2017-07-11 ENCOUNTER — Ambulatory Visit
Admission: RE | Admit: 2017-07-11 | Discharge: 2017-07-11 | Disposition: A | Discharge status: Home / Self Care | Payer: 59 | Source: Ambulatory Visit | Attending: Obstetrics & Gynecology | Admitting: Obstetrics & Gynecology

## 2017-07-11 DIAGNOSIS — Z1231 Encounter for screening mammogram for malignant neoplasm of breast: Secondary | ICD-10-CM

## 2017-07-11 DIAGNOSIS — Z0000 Encounter for general adult medical examination without abnormal findings: Secondary | ICD-10-CM | POA: Diagnosis not present

## 2017-07-25 DIAGNOSIS — G43009 Migraine without aura, not intractable, without status migrainosus: Secondary | ICD-10-CM | POA: Diagnosis not present

## 2017-07-25 DIAGNOSIS — K219 Gastro-esophageal reflux disease without esophagitis: Secondary | ICD-10-CM | POA: Diagnosis not present

## 2017-07-25 MED FILL — CARAFATE 1 GM/10 ML SUSP: 1 | 15 days supply | Qty: 300 | Fill #0

## 2017-08-14 MED FILL — ALPRAZolam 0.25 MG TABS: 0.25 | 30 days supply | Qty: 30 | Fill #0

## 2017-09-11 MED FILL — ESTRADIOL 0.05 MG/DAY PATCH: 0.05 | 84 days supply | Qty: 12 | Fill #1

## 2017-09-11 MED FILL — PROGESTERONE 100 MG CAPSULE: 100 | 90 days supply | Qty: 90 | Fill #1

## 2017-09-14 ENCOUNTER — Encounter: Payer: Self-pay | Admitting: Obstetrics & Gynecology

## 2017-09-14 ENCOUNTER — Ambulatory Visit (INDEPENDENT_AMBULATORY_CARE_PROVIDER_SITE_OTHER): Payer: 59 | Admitting: Obstetrics & Gynecology

## 2017-09-14 VITALS — BP 130/76 | Ht 61.75 in | Wt 163.0 lb

## 2017-09-14 DIAGNOSIS — Z79890 Hormone replacement therapy (postmenopausal): Secondary | ICD-10-CM

## 2017-09-14 DIAGNOSIS — Z124 Encounter for screening for malignant neoplasm of cervix: Secondary | ICD-10-CM

## 2017-09-14 DIAGNOSIS — Z01419 Encounter for gynecological examination (general) (routine) without abnormal findings: Secondary | ICD-10-CM | POA: Diagnosis not present

## 2017-09-14 DIAGNOSIS — Z309 Encounter for contraceptive management, unspecified: Secondary | ICD-10-CM | POA: Diagnosis not present

## 2017-09-14 DIAGNOSIS — Z9851 Tubal ligation status: Secondary | ICD-10-CM

## 2017-09-14 NOTE — Progress Notes (Signed)
Andrea Walsh 1965/04/03 [id number]   History:    52 y.o. G4P4A0L5 1 set of twin.  Married.  S/P TL.  Twins starting college, with a Lacrosse scolarship.  RP:  Established patient presenting for annual gyn exam   HPI: Symptomatic menopause well on hormone replacement therapy since May 2019.  Taking estradiol 0.05 patch and Prometrium 100 mg at bedtime.  No postmenopausal bleeding.  No pelvic pain.  No pain with intercourse.  Breasts normal.  Urine and bowel movements normal.  BMI 30.05.  Health labs with family physician.  Past medical history,surgical history, family history and social history were all reviewed and documented in the EPIC chart.  Gynecologic History Patient's last menstrual period was 04/14/2017. Contraception: post menopausal status and tubal ligation Last Pap: 2017. Results were: normal Last mammogram: 2019. Results were: Normal per patient, will obtain Bone Density: Never Colonoscopy: Never  Obstetric History OB History  Gravida Para Term Preterm AB Living  4 4       5   SAB TAB Ectopic Multiple Live Births        1      # Outcome Date GA Lbr Len/2nd Weight Sex Delivery Anes PTL Lv  4 Para           3 Para           2 Para           1 Para              ROS: A ROS was performed and pertinent positives and negatives are included in the history.  GENERAL: No fevers or chills. HEENT: No change in vision, no earache, sore throat or sinus congestion. NECK: No pain or stiffness. CARDIOVASCULAR: No chest pain or pressure. No palpitations. PULMONARY: No shortness of breath, cough or wheeze. GASTROINTESTINAL: No abdominal pain, nausea, vomiting or diarrhea, melena or bright red blood per rectum. GENITOURINARY: No urinary frequency, urgency, hesitancy or dysuria. MUSCULOSKELETAL: No joint or muscle pain, no back pain, no recent trauma. DERMATOLOGIC: No rash, no itching, no lesions. ENDOCRINE: No polyuria, polydipsia, no heat or cold intolerance. No recent change in  weight. HEMATOLOGICAL: No anemia or easy bruising or bleeding. NEUROLOGIC: No headache, seizures, numbness, tingling or weakness. PSYCHIATRIC: No depression, no loss of interest in normal activity or change in sleep pattern.     Exam:   BP 130/76   Ht 5' 1.75" (1.568 m)   Wt 163 lb (73.9 kg)   LMP 04/14/2017   BMI 30.05 kg/m   Body mass index is 30.05 kg/m.  General appearance : Well developed well nourished female. No acute distress HEENT: Eyes: no retinal hemorrhage or exudates,  Neck supple, trachea midline, no carotid bruits, no thyroidmegaly Lungs: Clear to auscultation, no rhonchi or wheezes, or rib retractions  Heart: Regular rate and rhythm, no murmurs or gallops Breast:Examined in sitting and supine position were symmetrical in appearance, no palpable masses or tenderness,  no skin retraction, no nipple inversion, no nipple discharge, no skin discoloration, no axillary or supraclavicular lymphadenopathy Abdomen: no palpable masses or tenderness, no rebound or guarding Extremities: no edema or skin discoloration or tenderness  Pelvic: Vulva: Normal             Vagina: No gross lesions or discharge  Cervix: No gross lesions or discharge.  Pap reflex done  Uterus  AV, normal size, shape and consistency, non-tender and mobile  Adnexa  Without masses or tenderness  Anus: Normal  Assessment/Plan:  52 y.o. female for annual exam   1. Encounter for physical examination, contraception, and Papanicolaou smear of cervix Normal gynecologic exam.  Pap reflex done.  Breast exam normal.  Screening mammogram normal in 2019 per patient, will obtain report.  Health labs with family physician.  Body mass index at 30.05.  Recommend a lower calorie/carb diet such as [employer] and regular aerobic activities 5 times a week with weightlifting every 2 days. - Pap IG w/ reflex to HPV when ASC-U  2. Post-menopause on HRT (hormone replacement therapy) Well on hormone replacement therapy  since May 2019.  Will continue on Prometrium 100 mg at bedtime and estradiol patch 0.05 weekly.  No contraindication to hormone replacement therapy.  Symptoms well controlled.  No postmenopausal bleeding.  3. Tubal ligation status  Other orders - methimazole (TAPAZOLE) 5 MG tablet; Take 5 mg by mouth 2 (two) times daily. - Prometrium 100 mg at bedtime prescribed x 1 year  - Estradiol patch 0.05 weekly prescribed x 1 year  [name] MD, 10:43 AM 09/14/2017

## 2017-09-15 LAB — PAP IG W/ RFLX HPV ASCU

## 2017-09-17 ENCOUNTER — Encounter: Payer: Self-pay | Admitting: Obstetrics & Gynecology

## 2017-09-17 NOTE — Patient Instructions (Signed)
1. Encounter for physical examination, contraception, and Papanicolaou smear of cervix Normal gynecologic exam.  Pap reflex done.  Breast exam normal.  Screening mammogram normal in 2019 per patient, will obtain report.  Health labs with family physician.  Body mass index at 30.05.  Recommend a lower calorie/carb diet such as [employer] and regular aerobic activities 5 times a week with weightlifting every 2 days. - Pap IG w/ reflex to HPV when ASC-U  2. Post-menopause on HRT (hormone replacement therapy) Well on hormone replacement therapy since May 2019.  Will continue on Prometrium 100 mg at bedtime and estradiol patch 0.05 weekly.  No contraindication to hormone replacement therapy.  Symptoms well controlled.  No postmenopausal bleeding.  3. Tubal ligation status  Other orders - methimazole (TAPAZOLE) 5 MG tablet; Take 5 mg by mouth 2 (two) times daily. - Prometrium 100 mg at bedtime prescribed x 1 year  - Estradiol patch 0.05 weekly prescribed x 1 year  Andrea Walsh, it was a pleasure seeing you today!  I will inform you of your results as soon as they are available.

## 2017-09-18 MED FILL — methIMAzole 5 MG TABS: 5 | 30 days supply | Qty: 30 | Fill #1

## 2017-12-04 MED FILL — ESTRADIOL 0.05 MG/DAY PATCH: 0.05 | 84 days supply | Qty: 12 | Fill #2

## 2017-12-18 MED FILL — methIMAzole 5 MG TABS: 5 | 30 days supply | Qty: 30 | Fill #2

## 2017-12-22 MED FILL — PROGESTERONE 100 MG CAPSULE: 100 | 90 days supply | Qty: 90 | Fill #2

## 2018-01-05 MED FILL — RIZATRIPTAN 10 MG ODT: 10 | 12 days supply | Qty: 12 | Fill #0

## 2018-01-30 DIAGNOSIS — G43009 Migraine without aura, not intractable, without status migrainosus: Secondary | ICD-10-CM | POA: Diagnosis not present

## 2018-01-30 DIAGNOSIS — G479 Sleep disorder, unspecified: Secondary | ICD-10-CM | POA: Diagnosis not present

## 2018-01-30 MED FILL — ALPRAZolam 0.25 MG TABS: 0.25 | 30 days supply | Qty: 30 | Fill #0

## 2018-02-22 MED FILL — RIZATRIPTAN 10 MG ODT: 10 | 20 days supply | Qty: 12 | Fill #1

## 2018-02-26 MED FILL — ESTRADIOL 0.05 MG/DAY PATCH: 0.05 | 84 days supply | Qty: 12 | Fill #3

## 2018-03-08 DIAGNOSIS — E0500 Thyrotoxicosis with diffuse goiter without thyrotoxic crisis or storm: Secondary | ICD-10-CM | POA: Diagnosis not present

## 2018-03-26 DIAGNOSIS — K7689 Other specified diseases of liver: Secondary | ICD-10-CM | POA: Diagnosis not present

## 2018-03-26 DIAGNOSIS — E0500 Thyrotoxicosis with diffuse goiter without thyrotoxic crisis or storm: Secondary | ICD-10-CM | POA: Diagnosis not present

## 2018-03-28 MED FILL — PROGESTERONE 100 MG CAPSULE: 100 | 90 days supply | Qty: 90 | Fill #3

## 2018-04-09 MED FILL — RIZATRIPTAN 10 MG ODT: 10 | 20 days supply | Qty: 12 | Fill #2

## 2018-04-23 DIAGNOSIS — E0500 Thyrotoxicosis with diffuse goiter without thyrotoxic crisis or storm: Secondary | ICD-10-CM | POA: Diagnosis not present

## 2018-05-10 MED FILL — RIZATRIPTAN 10 MG ODT: 10 | 20 days supply | Qty: 12 | Fill #3

## 2018-05-10 MED FILL — ESTRADIOL 0.05 MG/DAY PATCH: 0.05 | 84 days supply | Qty: 12 | Fill #4

## 2018-05-17 MED FILL — ZOMIG 5 MG NASAL SPRAY: 5 | 90 days supply | Qty: 24 | Fill #0

## 2018-07-02 ENCOUNTER — Other Ambulatory Visit: Payer: Self-pay | Admitting: Obstetrics & Gynecology

## 2018-07-02 MED FILL — PROGESTERONE 100 MG CAPSULE: 100 | 90 days supply | Qty: 90 | Fill #0

## 2018-07-24 ENCOUNTER — Other Ambulatory Visit: Payer: Self-pay | Admitting: Obstetrics & Gynecology

## 2018-07-24 MED FILL — ALPRAZolam 0.25 MG TABS: 0.25 | 30 days supply | Qty: 30 | Fill #1

## 2018-07-24 MED FILL — ESTRADIOL 0.05 MG/DAY PATCH: 0.05 | 84 days supply | Qty: 12 | Fill #0

## 2018-08-31 MED FILL — ZOMIG 5 MG NASAL SPRAY: 5 | 90 days supply | Qty: 24 | Fill #0

## 2018-09-17 ENCOUNTER — Encounter: Payer: 59 | Admitting: Obstetrics & Gynecology

## 2018-10-08 ENCOUNTER — Other Ambulatory Visit: Payer: Self-pay | Admitting: Obstetrics & Gynecology

## 2018-10-08 MED FILL — PROGESTERONE 100 MG CAPSULE: 100 | 90 days supply | Qty: 90 | Fill #0

## 2018-10-08 MED FILL — ESTRADIOL 0.05 MG/DAY PATCH: 0.05 | 84 days supply | Qty: 12 | Fill #0

## 2018-10-17 DIAGNOSIS — E0500 Thyrotoxicosis with diffuse goiter without thyrotoxic crisis or storm: Secondary | ICD-10-CM | POA: Diagnosis not present

## 2018-10-17 DIAGNOSIS — D485 Neoplasm of uncertain behavior of skin: Secondary | ICD-10-CM | POA: Diagnosis not present

## 2018-10-17 DIAGNOSIS — F430 Acute stress reaction: Secondary | ICD-10-CM | POA: Diagnosis not present

## 2018-10-17 DIAGNOSIS — G43009 Migraine without aura, not intractable, without status migrainosus: Secondary | ICD-10-CM | POA: Diagnosis not present

## 2018-10-17 DIAGNOSIS — Z13220 Encounter for screening for lipoid disorders: Secondary | ICD-10-CM | POA: Diagnosis not present

## 2018-10-17 DIAGNOSIS — R7989 Other specified abnormal findings of blood chemistry: Secondary | ICD-10-CM | POA: Diagnosis not present

## 2018-10-17 DIAGNOSIS — R945 Abnormal results of liver function studies: Secondary | ICD-10-CM | POA: Diagnosis not present

## 2018-10-17 DIAGNOSIS — Z0000 Encounter for general adult medical examination without abnormal findings: Secondary | ICD-10-CM | POA: Diagnosis not present

## 2018-10-17 MED FILL — IMIQUIMOD 5 % CREA: 5 | 56 days supply | Qty: 24 | Fill #0

## 2018-10-17 MED FILL — ALPRAZolam 0.25 MG TABS: 0.25 | 30 days supply | Qty: 30 | Fill #0

## 2018-10-23 DIAGNOSIS — E0500 Thyrotoxicosis with diffuse goiter without thyrotoxic crisis or storm: Secondary | ICD-10-CM | POA: Diagnosis not present

## 2018-10-23 DIAGNOSIS — K7689 Other specified diseases of liver: Secondary | ICD-10-CM | POA: Diagnosis not present

## 2018-11-02 DIAGNOSIS — L03032 Cellulitis of left toe: Secondary | ICD-10-CM | POA: Diagnosis not present

## 2018-11-02 MED FILL — SULFAMETHOXAZOLE-TMP DS TAB: 800-160 | 10 days supply | Qty: 20 | Fill #0

## 2018-11-30 ENCOUNTER — Other Ambulatory Visit: Payer: Self-pay

## 2018-11-30 ENCOUNTER — Encounter: Payer: Self-pay | Admitting: Obstetrics & Gynecology

## 2018-11-30 ENCOUNTER — Ambulatory Visit (INDEPENDENT_AMBULATORY_CARE_PROVIDER_SITE_OTHER): Payer: 59 | Admitting: Obstetrics & Gynecology

## 2018-11-30 VITALS — BP 122/80 | Ht 61.5 in | Wt 161.4 lb

## 2018-11-30 DIAGNOSIS — Z01419 Encounter for gynecological examination (general) (routine) without abnormal findings: Secondary | ICD-10-CM | POA: Diagnosis not present

## 2018-11-30 DIAGNOSIS — Z6830 Body mass index (BMI) 30.0-30.9, adult: Secondary | ICD-10-CM

## 2018-11-30 DIAGNOSIS — Z79890 Hormone replacement therapy (postmenopausal): Secondary | ICD-10-CM | POA: Diagnosis not present

## 2018-11-30 DIAGNOSIS — E6609 Other obesity due to excess calories: Secondary | ICD-10-CM | POA: Diagnosis not present

## 2018-11-30 DIAGNOSIS — Z9851 Tubal ligation status: Secondary | ICD-10-CM

## 2018-11-30 MED ORDER — PROGESTERONE MICRONIZED 100 MG PO CAPS: 100.0000 mg | ORAL_CAPSULE | Freq: Every day | ORAL | 4 refills | Status: DC

## 2018-11-30 MED ORDER — ESTRADIOL 0.05 MG/24HR TD PTWK: 0.0500 mg | MEDICATED_PATCH | TRANSDERMAL | 4 refills | Status: DC

## 2018-11-30 NOTE — Patient Instructions (Signed)
1. Well female exam with routine gynecological exam Normal gynecologic exam in menopause.  Pap test August 2019 was negative, no indication to repeat this year.  Breast status post bilateral implants normal.  Will schedule a screening mammogram now.  Health labs with family physician.  Will schedule screening colonoscopy through her family physician next year.  2. Post-menopause on HRT (hormone replacement therapy) Well on hormone replacement therapy since 2019.  No contraindication to continue.  Estradiol patch 0.05 weekly and Prometrium 100 mg 1 capsule at bedtime represcribed.  Vitamin D supplements, calcium intake of 1200 mg daily and regular weightbearing physical activity is recommended.  3. Tubal ligation status  4. Class 1 obesity due to excess calories without serious comorbidity with body mass index (BMI) of 30.0 to 30.9 in adult Recommend a lower calorie/carb diet such as [employer].  Intermittent fasting discussed.  Aerobic physical activities 5 times a week and weightlifting every 2 days.  Other orders - imiquimod (ALDARA) 5 % cream; Apply topically 3 (three) times a week. - estradiol (CLIMARA - DOSED IN MG/24 HR) 0.05 mg/24hr patch; Place 1 patch (0.05 mg total) onto the skin once a week. - progesterone (PROMETRIUM) 100 MG capsule; Take 1 capsule (100 mg total) by mouth at bedtime.  Andrea Walsh, it was a pleasure seeing you today!

## 2018-11-30 NOTE — Progress Notes (Signed)
Andrea Walsh 03/27/1965 [id number]   History:    53 y.o. G4P4A0L5 1 set of twin.  Married.  S/P TL.  Twin daughter 44 yo Sophomore in college, with a Lacrosse scolarship.  All children 31 yo and above, doing well.  RP:  Established patient presenting for annual gyn exam   HPI: Symptomatic menopause well on hormone replacement therapy since May 2019.  Taking estradiol 0.05 patch and Prometrium 100 mg at bedtime.  No postmenopausal bleeding.  No pelvic pain.  No pain with intercourse.  Breasts normal s/p bilateral implants.  Urine and bowel movements normal.  BMI 30.0.  Health labs with family physician.   Past medical history,surgical history, family history and social history were all reviewed and documented in the EPIC chart.  Gynecologic History No LMP recorded. (Menstrual status: Other). Contraception: tubal ligation and Menopause Last Pap: 08/2017. Results were: Negative Last mammogram: 06/2017. Results were: Negative Bone Density: Never Colonoscopy: Will schedule thru Fam MD next yr  Obstetric History OB History  Gravida Para Term Preterm AB Living  4 4       5   SAB TAB Ectopic Multiple Live Births        1      # Outcome Date GA Lbr Len/2nd Weight Sex Delivery Anes PTL Lv  4 Para           3 Para           2 Para           1 Para              ROS: A ROS was performed and pertinent positives and negatives are included in the history.  GENERAL: No fevers or chills. HEENT: No change in vision, no earache, sore throat or sinus congestion. NECK: No pain or stiffness. CARDIOVASCULAR: No chest pain or pressure. No palpitations. PULMONARY: No shortness of breath, cough or wheeze. GASTROINTESTINAL: No abdominal pain, nausea, vomiting or diarrhea, melena or bright red blood per rectum. GENITOURINARY: No urinary frequency, urgency, hesitancy or dysuria. MUSCULOSKELETAL: No joint or muscle pain, no back pain, no recent trauma. DERMATOLOGIC: No rash, no itching, no lesions.  ENDOCRINE: No polyuria, polydipsia, no heat or cold intolerance. No recent change in weight. HEMATOLOGICAL: No anemia or easy bruising or bleeding. NEUROLOGIC: No headache, seizures, numbness, tingling or weakness. PSYCHIATRIC: No depression, no loss of interest in normal activity or change in sleep pattern.     Exam:   BP 122/80   Ht 5' 1.5" (1.562 m)   Wt 161 lb 6.4 oz (73.2 kg)   BMI 30.00 kg/m   Body mass index is 30 kg/m.  General appearance : Well developed well nourished female. No acute distress HEENT: Eyes: no retinal hemorrhage or exudates,  Neck supple, trachea midline, no carotid bruits, no thyroidmegaly Lungs: Clear to auscultation, no rhonchi or wheezes, or rib retractions  Heart: Regular rate and rhythm, no murmurs or gallops Breast:Examined in sitting and supine position were symmetrical in appearance, no palpable masses or tenderness,  no skin retraction, no nipple inversion, no nipple discharge, no skin discoloration, no axillary or supraclavicular lymphadenopathy Abdomen: no palpable masses or tenderness, no rebound or guarding Extremities: no edema or skin discoloration or tenderness  Pelvic: Vulva: Normal             Vagina: No gross lesions or discharge  Cervix: No gross lesions or discharge  Uterus  AV, normal size, shape and consistency, non-tender and mobile  Adnexa  Without masses or tenderness  Anus: Normal   Assessment/Plan:  53 y.o. female for annual exam   1. Well female exam with routine gynecological exam Normal gynecologic exam in menopause.  Pap test August 2019 was negative, no indication to repeat this year.  Breast status post bilateral implants normal.  Will schedule a screening mammogram now.  Health labs with family physician.  Will schedule screening colonoscopy through her family physician next year.  2. Post-menopause on HRT (hormone replacement therapy) Well on hormone replacement therapy since 2019.  No contraindication to continue.   Estradiol patch 0.05 weekly and Prometrium 100 mg 1 capsule at bedtime represcribed.  Vitamin D supplements, calcium intake of 1200 mg daily and regular weightbearing physical activity is recommended.  3. Tubal ligation status  4. Class 1 obesity due to excess calories without serious comorbidity with body mass index (BMI) of 30.0 to 30.9 in adult Recommend a lower calorie/carb diet such as [employer].  Intermittent fasting discussed.  Aerobic physical activities 5 times a week and weightlifting every 2 days.  Other orders - imiquimod (ALDARA) 5 % cream; Apply topically 3 (three) times a week. - estradiol (CLIMARA - DOSED IN MG/24 HR) 0.05 mg/24hr patch; Place 1 patch (0.05 mg total) onto the skin once a week. - progesterone (PROMETRIUM) 100 MG capsule; Take 1 capsule (100 mg total) by mouth at bedtime.  [name] MD, 9:14 AM 11/30/2018

## 2018-12-24 MED FILL — ALPRAZolam 0.25 MG TABS: 0.25 | 30 days supply | Qty: 30 | Fill #1

## 2018-12-26 MED FILL — ZOMIG 5 MG NASAL SPRAY: 5 | 90 days supply | Qty: 24 | Fill #0

## 2018-12-26 MED FILL — ESTRADIOL 0.05 MG/DAY PATCH: 0.05 | 84 days supply | Qty: 12 | Fill #0

## 2018-12-26 MED FILL — METHOCARBAMOL 500 MG TABS: 500 | 10 days supply | Qty: 60 | Fill #0

## 2018-12-26 MED FILL — PROGESTERONE 100 MG CAPSULE: 100 | 90 days supply | Qty: 90 | Fill #0

## 2019-04-10 MED FILL — ESTRADIOL 0.05 MG/DAY PATCH: 0.05 | 84 days supply | Qty: 12 | Fill #1

## 2019-04-10 MED FILL — PROGESTERONE MICRONIZED 100: 100 | 90 days supply | Qty: 90 | Fill #1

## 2019-05-01 DIAGNOSIS — H53041 Amblyopia suspect, right eye: Secondary | ICD-10-CM | POA: Diagnosis not present

## 2019-05-01 DIAGNOSIS — H2511 Age-related nuclear cataract, right eye: Secondary | ICD-10-CM | POA: Diagnosis not present

## 2019-05-01 DIAGNOSIS — Q130 Coloboma of iris: Secondary | ICD-10-CM | POA: Diagnosis not present

## 2019-05-01 DIAGNOSIS — H40053 Ocular hypertension, bilateral: Secondary | ICD-10-CM | POA: Diagnosis not present

## 2019-05-17 MED FILL — ZOLMITRIPTAN 5 MG SOLN: 5 | 90 days supply | Qty: 24 | Fill #0

## 2019-05-17 MED FILL — ALPRAZolam 0.25 MG TABS: 0.25 | 30 days supply | Qty: 30 | Fill #0

## 2019-05-21 DIAGNOSIS — E0500 Thyrotoxicosis with diffuse goiter without thyrotoxic crisis or storm: Secondary | ICD-10-CM | POA: Diagnosis not present

## 2019-05-21 DIAGNOSIS — H2511 Age-related nuclear cataract, right eye: Secondary | ICD-10-CM | POA: Diagnosis not present

## 2019-05-21 DIAGNOSIS — E0590 Thyrotoxicosis, unspecified without thyrotoxic crisis or storm: Secondary | ICD-10-CM | POA: Diagnosis not present

## 2019-05-21 DIAGNOSIS — K7689 Other specified diseases of liver: Secondary | ICD-10-CM | POA: Diagnosis not present

## 2019-05-21 DIAGNOSIS — H2513 Age-related nuclear cataract, bilateral: Secondary | ICD-10-CM | POA: Diagnosis not present

## 2019-05-21 MED FILL — PROLENSA 0.07% EYE DROPS: 0.07 | 60 days supply | Qty: 3 | Fill #0

## 2019-05-21 MED FILL — LOTEMAX SM 0.38 % GEL: 0.38 | 80 days supply | Qty: 5 | Fill #0

## 2019-05-29 DIAGNOSIS — E0500 Thyrotoxicosis with diffuse goiter without thyrotoxic crisis or storm: Secondary | ICD-10-CM | POA: Diagnosis not present

## 2019-05-29 DIAGNOSIS — K7689 Other specified diseases of liver: Secondary | ICD-10-CM | POA: Diagnosis not present

## 2019-05-29 DIAGNOSIS — E0590 Thyrotoxicosis, unspecified without thyrotoxic crisis or storm: Secondary | ICD-10-CM | POA: Diagnosis not present

## 2019-06-11 DIAGNOSIS — H2511 Age-related nuclear cataract, right eye: Secondary | ICD-10-CM | POA: Diagnosis not present

## 2019-06-11 DIAGNOSIS — Q130 Coloboma of iris: Secondary | ICD-10-CM | POA: Diagnosis not present

## 2019-06-11 DIAGNOSIS — H2620 Unspecified complicated cataract: Secondary | ICD-10-CM | POA: Diagnosis not present

## 2019-06-25 DIAGNOSIS — E0590 Thyrotoxicosis, unspecified without thyrotoxic crisis or storm: Secondary | ICD-10-CM | POA: Diagnosis not present

## 2019-06-25 DIAGNOSIS — E0500 Thyrotoxicosis with diffuse goiter without thyrotoxic crisis or storm: Secondary | ICD-10-CM | POA: Diagnosis not present

## 2019-06-25 DIAGNOSIS — K7689 Other specified diseases of liver: Secondary | ICD-10-CM | POA: Diagnosis not present

## 2019-06-25 MED FILL — ALPRAZolam 0.25 MG TABS: 0.25 | 30 days supply | Qty: 30 | Fill #1

## 2019-07-22 MED FILL — ESTRADIOL 0.05 MG/DAY PATCH: 0.05 | 84 days supply | Qty: 12 | Fill #2

## 2019-07-31 MED FILL — PROGESTERONE 100 MG CAPS: 100 | 90 days supply | Qty: 90 | Fill #2

## 2019-09-12 DIAGNOSIS — E0590 Thyrotoxicosis, unspecified without thyrotoxic crisis or storm: Secondary | ICD-10-CM | POA: Diagnosis not present

## 2019-09-12 DIAGNOSIS — K7689 Other specified diseases of liver: Secondary | ICD-10-CM | POA: Diagnosis not present

## 2019-09-12 DIAGNOSIS — E0500 Thyrotoxicosis with diffuse goiter without thyrotoxic crisis or storm: Secondary | ICD-10-CM | POA: Diagnosis not present

## 2019-09-25 MED FILL — ZOLMITRIPTAN 5 MG SOLN: 5 | 90 days supply | Qty: 24 | Fill #0

## 2019-10-22 MED FILL — ESTRADIOL 0.05 MG/DAY PATCH: 0.05 | 84 days supply | Qty: 12 | Fill #3

## 2019-10-29 ENCOUNTER — Other Ambulatory Visit (HOSPITAL_COMMUNITY): Payer: Self-pay | Admitting: Family Medicine

## 2019-10-29 DIAGNOSIS — Z79899 Other long term (current) drug therapy: Secondary | ICD-10-CM | POA: Diagnosis not present

## 2019-10-29 DIAGNOSIS — Z0000 Encounter for general adult medical examination without abnormal findings: Secondary | ICD-10-CM | POA: Diagnosis not present

## 2019-10-29 DIAGNOSIS — G43009 Migraine without aura, not intractable, without status migrainosus: Secondary | ICD-10-CM | POA: Diagnosis not present

## 2019-10-29 DIAGNOSIS — Z13220 Encounter for screening for lipoid disorders: Secondary | ICD-10-CM | POA: Diagnosis not present

## 2019-10-29 MED FILL — ALPRAZolam 0.25 MG TABS: 0.25 | 30 days supply | Qty: 30 | Fill #0

## 2019-10-29 MED FILL — PROGESTERONE 100 MG CAPS: 100 | 90 days supply | Qty: 90 | Fill #3

## 2019-12-24 MED FILL — ALPRAZolam 0.25 MG TABS: 0.25 | 30 days supply | Qty: 30 | Fill #1

## 2020-01-08 ENCOUNTER — Other Ambulatory Visit: Payer: Self-pay | Admitting: Obstetrics & Gynecology

## 2020-01-08 ENCOUNTER — Telehealth: Payer: Self-pay | Admitting: *Deleted

## 2020-01-08 MED ORDER — ESTRADIOL 0.05 MG/24HR TD PTWK: 0.0500 mg | MEDICATED_PATCH | TRANSDERMAL | 0 refills | Status: DC

## 2020-01-08 MED ORDER — PROGESTERONE MICRONIZED 100 MG PO CAPS: 100.0000 mg | ORAL_CAPSULE | Freq: Every day | ORAL | 0 refills | Status: DC

## 2020-01-08 MED FILL — ESTRADIOL 0.05 MG/DAY PATCH: 0.05 | 84 days supply | Qty: 12 | Fill #0

## 2020-01-08 NOTE — Telephone Encounter (Signed)
Patient called requesting refill on estradiol patch 0.05 mg and progesterone 100 mg capsule. Annual exam scheduled on 03/03/20

## 2020-01-20 DIAGNOSIS — Z03818 Encounter for observation for suspected exposure to other biological agents ruled out: Secondary | ICD-10-CM | POA: Diagnosis not present

## 2020-01-22 DIAGNOSIS — H40053 Ocular hypertension, bilateral: Secondary | ICD-10-CM | POA: Diagnosis not present

## 2020-01-22 DIAGNOSIS — H53041 Amblyopia suspect, right eye: Secondary | ICD-10-CM | POA: Diagnosis not present

## 2020-01-22 DIAGNOSIS — Q148 Other congenital malformations of posterior segment of eye: Secondary | ICD-10-CM | POA: Diagnosis not present

## 2020-01-22 DIAGNOSIS — H26491 Other secondary cataract, right eye: Secondary | ICD-10-CM | POA: Diagnosis not present

## 2020-02-05 ENCOUNTER — Other Ambulatory Visit: Payer: Self-pay | Admitting: Obstetrics & Gynecology

## 2020-02-05 MED FILL — PROGESTERONE 100 MG CAPS: 100 | 90 days supply | Qty: 90 | Fill #0

## 2020-02-06 ENCOUNTER — Other Ambulatory Visit: Payer: Self-pay

## 2020-02-06 NOTE — Telephone Encounter (Signed)
Last CE 11/30/18. Patient is scheduled for 03/03/20

## 2020-02-11 MED FILL — ZOLMITRIPTAN 5 MG SOLN: 5 | 90 days supply | Qty: 24 | Fill #0

## 2020-02-17 ENCOUNTER — Other Ambulatory Visit: Payer: Self-pay | Admitting: Obstetrics & Gynecology

## 2020-02-17 DIAGNOSIS — Z1231 Encounter for screening mammogram for malignant neoplasm of breast: Secondary | ICD-10-CM

## 2020-03-03 ENCOUNTER — Ambulatory Visit (INDEPENDENT_AMBULATORY_CARE_PROVIDER_SITE_OTHER): Payer: 59 | Admitting: Obstetrics & Gynecology

## 2020-03-03 ENCOUNTER — Other Ambulatory Visit: Payer: Self-pay | Admitting: Obstetrics & Gynecology

## 2020-03-03 ENCOUNTER — Encounter: Payer: Self-pay | Admitting: Obstetrics & Gynecology

## 2020-03-03 ENCOUNTER — Other Ambulatory Visit: Payer: Self-pay

## 2020-03-03 VITALS — BP 122/80 | Ht 61.0 in | Wt 164.0 lb

## 2020-03-03 DIAGNOSIS — E6609 Other obesity due to excess calories: Secondary | ICD-10-CM | POA: Diagnosis not present

## 2020-03-03 DIAGNOSIS — Z01419 Encounter for gynecological examination (general) (routine) without abnormal findings: Secondary | ICD-10-CM | POA: Diagnosis not present

## 2020-03-03 DIAGNOSIS — Z6830 Body mass index (BMI) 30.0-30.9, adult: Secondary | ICD-10-CM

## 2020-03-03 DIAGNOSIS — Z79890 Hormone replacement therapy (postmenopausal): Secondary | ICD-10-CM | POA: Diagnosis not present

## 2020-03-03 MED ORDER — PROGESTERONE MICRONIZED 100 MG PO CAPS: 100.0000 mg | ORAL_CAPSULE | Freq: Every day | ORAL | 4 refills | Status: DC

## 2020-03-03 MED ORDER — ESTRADIOL 0.05 MG/24HR TD PTWK: 0.0500 mg | MEDICATED_PATCH | TRANSDERMAL | 4 refills | Status: DC

## 2020-03-03 NOTE — Addendum Note (Signed)
Addended by: Lorine Bears on: 03/03/2020 11:58 AM   Modules accepted: Orders

## 2020-03-03 NOTE — Progress Notes (Signed)
Andrea Walsh 27-Jul-1965 [id number]   History:    55 y.o. G4P4A0L5 1 set of twin. Married. S/P TL.  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  All children 87 yo and above, doing well.  3 grand-children.  FG:[id number] presenting for annual gyn exam   FBP:[id number] menopause well on hormone replacement therapy since May 2019. Taking estradiol 0.05 patch and Prometrium 100 mg at bedtime. No postmenopausal bleeding. No pelvic pain. No pain with intercourse. Breasts normal s/p bilateral implants. Urine and bowel movements normal. BMI 30.99.Health labs with family physician.  No Colono yet.  Past medical history,surgical history, family history and social history were all reviewed and documented in the EPIC chart.  Gynecologic History No LMP recorded. (Menstrual status: Perimenopausal).  Obstetric History OB History  Gravida Para Term Preterm AB Living  4 4     0 5  SAB IAB Ectopic Multiple Live Births  0   0 1      # Outcome Date GA Lbr Len/2nd Weight Sex Delivery Anes PTL Lv  4 Para           3 Para           2 Para           1 Para              ROS: A ROS was performed and pertinent positives and negatives are included in the history.  GENERAL: No fevers or chills. HEENT: No change in vision, no earache, sore throat or sinus congestion. NECK: No pain or stiffness. CARDIOVASCULAR: No chest pain or pressure. No palpitations. PULMONARY: No shortness of breath, cough or wheeze. GASTROINTESTINAL: No abdominal pain, nausea, vomiting or diarrhea, melena or bright red blood per rectum. GENITOURINARY: No urinary frequency, urgency, hesitancy or dysuria. MUSCULOSKELETAL: No joint or muscle pain, no back pain, no recent trauma. DERMATOLOGIC: No rash, no itching, no lesions. ENDOCRINE: No polyuria, polydipsia, no heat or cold intolerance. No recent change in weight. HEMATOLOGICAL: No anemia or easy bruising or bleeding. NEUROLOGIC:  No headache, seizures, numbness, tingling or weakness. PSYCHIATRIC: No depression, no loss of interest in normal activity or change in sleep pattern.     Exam:   BP 122/80 (BP Location: Right Arm, Patient Position: Sitting, Cuff Size: Normal)   Ht 5\' 1"  (1.549 m)   Wt 164 lb (74.4 kg)   BMI 30.99 kg/m   Body mass index is 30.99 kg/m.  General appearance : Well developed well nourished female. No acute distress HEENT: Eyes: no retinal hemorrhage or exudates,  Neck supple, trachea midline, no carotid bruits, no thyroidmegaly Lungs: Clear to auscultation, no rhonchi or wheezes, or rib retractions  Heart: Regular rate and rhythm, no murmurs or gallops Breast:Examined in sitting and supine position were symmetrical in appearance, no palpable masses or tenderness,  no skin retraction, no nipple inversion, no nipple discharge, no skin discoloration, no axillary or supraclavicular lymphadenopathy Abdomen: no palpable masses or tenderness, no rebound or guarding Extremities: no edema or skin discoloration or tenderness  Pelvic: Vulva: Normal             Vagina: No gross lesions or discharge  Cervix: No gross lesions or discharge.  Pap reflex done.  Uterus  AV, normal size, shape and consistency, non-tender and mobile  Adnexa  Without masses or tenderness  Anus: Normal   Assessment/Plan:  55 y.o. female for annual exam   1. Encounter for routine gynecological  examination with Papanicolaou smear of cervix Normal gynecologic exam.  Pap reflex done.  Breast exam normal.  Screening mammogram scheduled in March 2022.  Will schedule a colonoscopy through her family physician.  Health labs with family physician.  2. Post-menopause on HRT (hormone replacement therapy) Well on hormone replacement therapy since 2019.  No contraindication to continue.  Estradiol patch 0.05 weekly and progesterone 100 mg per mouth at bedtime sent to pharmacy.  Recommend vitamin D supplements, a total of 1500 mg of  calcium and regular weightbearing physical activities.  3. Class 1 obesity due to excess calories without serious comorbidity with body mass index (BMI) of 30.0 to 30.9 in adult Recommend aerobic activities 5 times a week and light weightlifting every 2 days.  Low calorie/carb nutrition.  Other orders - progesterone (PROMETRIUM) 100 MG capsule; Take 1 capsule (100 mg total) by mouth at bedtime. - estradiol (CLIMARA - DOSED IN MG/24 HR) 0.05 mg/24hr patch; Place 1 patch (0.05 mg total) onto the skin once a week.  [name] MD, 10:27 AM 03/03/2020

## 2020-03-05 LAB — PAP IG W/ RFLX HPV ASCU

## 2020-03-31 ENCOUNTER — Other Ambulatory Visit: Payer: Self-pay

## 2020-03-31 ENCOUNTER — Ambulatory Visit
Admission: RE | Admit: 2020-03-31 | Discharge: 2020-03-31 | Disposition: A | Discharge status: Home / Self Care | Payer: 59 | Source: Ambulatory Visit | Attending: Obstetrics & Gynecology | Admitting: Obstetrics & Gynecology

## 2020-03-31 DIAGNOSIS — Z1231 Encounter for screening mammogram for malignant neoplasm of breast: Secondary | ICD-10-CM

## 2020-04-16 MED FILL — ESTRADIOL 0.05 MG/DAY PATCH: 0.05 | 84 days supply | Qty: 12 | Fill #0

## 2020-04-17 MED FILL — PROGESTERONE 100 MG CAPS: 100 | 90 days supply | Qty: 90 | Fill #0

## 2020-05-28 ENCOUNTER — Other Ambulatory Visit (HOSPITAL_COMMUNITY): Payer: Self-pay

## 2020-05-28 MED FILL — Zolmitriptan Nasal Spray 5 MG/Spray Unit: NASAL | 90 days supply | Qty: 24 | Fill #0 | Status: AC

## 2020-05-29 ENCOUNTER — Other Ambulatory Visit (HOSPITAL_COMMUNITY): Payer: Self-pay

## 2020-06-03 ENCOUNTER — Other Ambulatory Visit (HOSPITAL_COMMUNITY): Payer: Self-pay

## 2020-06-26 ENCOUNTER — Other Ambulatory Visit (HOSPITAL_COMMUNITY): Payer: Self-pay

## 2020-06-26 MED ORDER — NURTEC 75 MG PO TBDP
ORAL_TABLET | ORAL | 5 refills | Status: DC
  Filled 2020-06-26: qty 8, 30d supply, fill #0
  Filled 2020-08-13: qty 8, 30d supply, fill #1
  Filled 2020-10-03: qty 8, 30d supply, fill #2
  Filled 2020-11-24: qty 8, 30d supply, fill #3
  Filled 2021-01-03: qty 8, 30d supply, fill #4
  Filled 2021-02-11: qty 8, 30d supply, fill #5

## 2020-06-26 MED ORDER — ALPRAZOLAM 0.25 MG PO TABS
0.2500 mg | ORAL_TABLET | Freq: Every day | ORAL | 0 refills | Status: DC | PRN
  Filled 2020-06-26: qty 30, 30d supply, fill #0

## 2020-07-02 ENCOUNTER — Other Ambulatory Visit (HOSPITAL_COMMUNITY): Payer: Self-pay

## 2020-07-03 ENCOUNTER — Other Ambulatory Visit (HOSPITAL_COMMUNITY): Payer: Self-pay

## 2020-07-06 ENCOUNTER — Other Ambulatory Visit (HOSPITAL_COMMUNITY): Payer: Self-pay

## 2020-07-06 MED FILL — Estradiol TD Patch Weekly 0.05 MG/24HR: TRANSDERMAL | 84 days supply | Qty: 12 | Fill #0 | Status: AC

## 2020-07-07 ENCOUNTER — Other Ambulatory Visit (HOSPITAL_COMMUNITY): Payer: Self-pay

## 2020-07-08 ENCOUNTER — Other Ambulatory Visit (HOSPITAL_COMMUNITY): Payer: Self-pay

## 2020-07-08 DIAGNOSIS — R0982 Postnasal drip: Secondary | ICD-10-CM | POA: Diagnosis not present

## 2020-07-08 DIAGNOSIS — J40 Bronchitis, not specified as acute or chronic: Secondary | ICD-10-CM | POA: Diagnosis not present

## 2020-07-08 DIAGNOSIS — R059 Cough, unspecified: Secondary | ICD-10-CM | POA: Diagnosis not present

## 2020-07-08 MED ORDER — BENZONATATE 100 MG PO CAPS
100.0000 mg | ORAL_CAPSULE | Freq: Three times a day (TID) | ORAL | 0 refills | Status: DC | PRN
  Filled 2020-07-08: qty 60, 10d supply, fill #0

## 2020-08-11 ENCOUNTER — Other Ambulatory Visit (HOSPITAL_COMMUNITY): Payer: Self-pay

## 2020-08-11 MED FILL — Progesterone Cap 100 MG: ORAL | 90 days supply | Qty: 90 | Fill #0 | Status: AC

## 2020-08-12 ENCOUNTER — Other Ambulatory Visit (HOSPITAL_COMMUNITY): Payer: Self-pay

## 2020-08-13 ENCOUNTER — Other Ambulatory Visit (HOSPITAL_COMMUNITY): Payer: Self-pay

## 2020-08-27 DIAGNOSIS — E0590 Thyrotoxicosis, unspecified without thyrotoxic crisis or storm: Secondary | ICD-10-CM | POA: Diagnosis not present

## 2020-08-27 DIAGNOSIS — K7689 Other specified diseases of liver: Secondary | ICD-10-CM | POA: Diagnosis not present

## 2020-08-31 ENCOUNTER — Other Ambulatory Visit (HOSPITAL_COMMUNITY): Payer: Self-pay

## 2020-08-31 MED FILL — Estradiol TD Patch Weekly 0.05 MG/24HR: TRANSDERMAL | 84 days supply | Qty: 12 | Fill #1 | Status: AC

## 2020-09-01 DIAGNOSIS — R002 Palpitations: Secondary | ICD-10-CM | POA: Diagnosis not present

## 2020-09-01 DIAGNOSIS — K7689 Other specified diseases of liver: Secondary | ICD-10-CM | POA: Diagnosis not present

## 2020-09-01 DIAGNOSIS — E0590 Thyrotoxicosis, unspecified without thyrotoxic crisis or storm: Secondary | ICD-10-CM | POA: Diagnosis not present

## 2020-09-01 DIAGNOSIS — E0500 Thyrotoxicosis with diffuse goiter without thyrotoxic crisis or storm: Secondary | ICD-10-CM | POA: Diagnosis not present

## 2020-09-14 ENCOUNTER — Other Ambulatory Visit (HOSPITAL_COMMUNITY): Payer: Self-pay

## 2020-10-03 ENCOUNTER — Other Ambulatory Visit (HOSPITAL_COMMUNITY): Payer: Self-pay

## 2020-11-13 ENCOUNTER — Other Ambulatory Visit (HOSPITAL_COMMUNITY): Payer: Self-pay

## 2020-11-13 MED FILL — Progesterone Cap 100 MG: ORAL | 90 days supply | Qty: 90 | Fill #1 | Status: AC

## 2020-11-24 ENCOUNTER — Other Ambulatory Visit (HOSPITAL_COMMUNITY): Payer: Self-pay

## 2020-12-01 ENCOUNTER — Other Ambulatory Visit (HOSPITAL_COMMUNITY): Payer: Self-pay

## 2020-12-01 DIAGNOSIS — E7800 Pure hypercholesterolemia, unspecified: Secondary | ICD-10-CM | POA: Diagnosis not present

## 2020-12-01 DIAGNOSIS — Z0000 Encounter for general adult medical examination without abnormal findings: Secondary | ICD-10-CM | POA: Diagnosis not present

## 2020-12-01 DIAGNOSIS — G43009 Migraine without aura, not intractable, without status migrainosus: Secondary | ICD-10-CM | POA: Diagnosis not present

## 2020-12-01 DIAGNOSIS — F430 Acute stress reaction: Secondary | ICD-10-CM | POA: Diagnosis not present

## 2020-12-01 DIAGNOSIS — M62838 Other muscle spasm: Secondary | ICD-10-CM | POA: Diagnosis not present

## 2020-12-01 DIAGNOSIS — Z23 Encounter for immunization: Secondary | ICD-10-CM | POA: Diagnosis not present

## 2020-12-01 DIAGNOSIS — Z13220 Encounter for screening for lipoid disorders: Secondary | ICD-10-CM | POA: Diagnosis not present

## 2020-12-01 DIAGNOSIS — E0590 Thyrotoxicosis, unspecified without thyrotoxic crisis or storm: Secondary | ICD-10-CM | POA: Diagnosis not present

## 2020-12-01 MED ORDER — ALPRAZOLAM 0.25 MG PO TABS
ORAL_TABLET | ORAL | 2 refills | Status: DC
  Filled 2020-12-01: qty 30, 30d supply, fill #0
  Filled 2021-02-11: qty 30, 30d supply, fill #1
  Filled 2021-03-26: qty 30, 30d supply, fill #2

## 2020-12-01 MED ORDER — METHOCARBAMOL 500 MG PO TABS
ORAL_TABLET | ORAL | 3 refills | Status: DC
  Filled 2020-12-01: qty 60, 30d supply, fill #0
  Filled 2021-04-25: qty 60, 30d supply, fill #1

## 2020-12-01 MED ORDER — NURTEC 75 MG PO TBDP
ORAL_TABLET | ORAL | 5 refills | Status: DC
  Filled 2020-12-01 – 2021-06-19 (×2): qty 8, 30d supply, fill #0
  Filled 2021-07-31: qty 8, 30d supply, fill #1
  Filled 2021-09-07: qty 8, 30d supply, fill #2
  Filled 2021-10-24: qty 8, 30d supply, fill #3
  Filled 2021-11-24: qty 8, 30d supply, fill #4

## 2020-12-25 ENCOUNTER — Other Ambulatory Visit (HOSPITAL_COMMUNITY): Payer: Self-pay

## 2021-01-03 MED FILL — Estradiol TD Patch Weekly 0.05 MG/24HR: TRANSDERMAL | 84 days supply | Qty: 12 | Fill #2 | Status: AC

## 2021-01-04 ENCOUNTER — Other Ambulatory Visit (HOSPITAL_COMMUNITY): Payer: Self-pay

## 2021-01-05 ENCOUNTER — Other Ambulatory Visit (HOSPITAL_COMMUNITY): Payer: Self-pay

## 2021-02-11 MED FILL — Progesterone Cap 100 MG: ORAL | 90 days supply | Qty: 90 | Fill #2 | Status: AC

## 2021-02-12 ENCOUNTER — Other Ambulatory Visit (HOSPITAL_COMMUNITY): Payer: Self-pay

## 2021-03-01 DIAGNOSIS — Z23 Encounter for immunization: Secondary | ICD-10-CM | POA: Diagnosis not present

## 2021-03-08 ENCOUNTER — Other Ambulatory Visit: Payer: Self-pay | Admitting: Obstetrics & Gynecology

## 2021-03-09 ENCOUNTER — Other Ambulatory Visit (HOSPITAL_COMMUNITY): Payer: Self-pay

## 2021-03-09 ENCOUNTER — Other Ambulatory Visit: Payer: Self-pay | Admitting: Obstetrics & Gynecology

## 2021-03-13 ENCOUNTER — Other Ambulatory Visit (HOSPITAL_COMMUNITY): Payer: Self-pay

## 2021-03-16 ENCOUNTER — Other Ambulatory Visit: Payer: Self-pay | Admitting: *Deleted

## 2021-03-16 NOTE — Telephone Encounter (Signed)
Patient called requesting refill on climara patch 0.05 mg and progesterone 100 mg capsules. Annual exam scheduled on 05/10/21. Last annual exam 03/03/21   Rx sent.

## 2021-03-17 ENCOUNTER — Other Ambulatory Visit (HOSPITAL_COMMUNITY): Payer: Self-pay

## 2021-03-17 MED ORDER — ESTRADIOL 0.05 MG/24HR TD PTWK
MEDICATED_PATCH | TRANSDERMAL | 0 refills | Status: DC
  Filled 2021-03-17 (×2): qty 12, 84d supply, fill #0

## 2021-03-17 MED ORDER — PROGESTERONE MICRONIZED 100 MG PO CAPS
ORAL_CAPSULE | Freq: Every day | ORAL | 0 refills | Status: DC
  Filled 2021-03-17: qty 90, fill #0
  Filled 2021-05-19: qty 90, 90d supply, fill #0

## 2021-03-18 ENCOUNTER — Other Ambulatory Visit (HOSPITAL_COMMUNITY): Payer: Self-pay

## 2021-03-23 ENCOUNTER — Other Ambulatory Visit (HOSPITAL_COMMUNITY): Payer: Self-pay

## 2021-03-23 MED ORDER — NURTEC 75 MG PO TBDP
ORAL_TABLET | ORAL | 5 refills | Status: DC
  Filled 2021-03-23: qty 8, 30d supply, fill #0
  Filled 2021-05-08: qty 8, 30d supply, fill #1

## 2021-03-24 ENCOUNTER — Other Ambulatory Visit (HOSPITAL_COMMUNITY): Payer: Self-pay

## 2021-03-26 ENCOUNTER — Other Ambulatory Visit (HOSPITAL_COMMUNITY): Payer: Self-pay

## 2021-04-05 ENCOUNTER — Other Ambulatory Visit (HOSPITAL_COMMUNITY): Payer: Self-pay

## 2021-04-23 DIAGNOSIS — Z6831 Body mass index (BMI) 31.0-31.9, adult: Secondary | ICD-10-CM | POA: Diagnosis not present

## 2021-04-23 DIAGNOSIS — M549 Dorsalgia, unspecified: Secondary | ICD-10-CM | POA: Diagnosis not present

## 2021-04-26 ENCOUNTER — Other Ambulatory Visit (HOSPITAL_COMMUNITY): Payer: Self-pay

## 2021-04-26 DIAGNOSIS — Z6831 Body mass index (BMI) 31.0-31.9, adult: Secondary | ICD-10-CM | POA: Diagnosis not present

## 2021-04-26 DIAGNOSIS — M62830 Muscle spasm of back: Secondary | ICD-10-CM | POA: Diagnosis not present

## 2021-04-26 MED ORDER — CELECOXIB 200 MG PO CAPS
ORAL_CAPSULE | ORAL | 1 refills | Status: DC
  Filled 2021-04-26: qty 60, 30d supply, fill #0

## 2021-04-29 DIAGNOSIS — M545 Low back pain, unspecified: Secondary | ICD-10-CM | POA: Diagnosis not present

## 2021-05-08 ENCOUNTER — Other Ambulatory Visit (HOSPITAL_COMMUNITY): Payer: Self-pay

## 2021-05-10 ENCOUNTER — Ambulatory Visit: Payer: 59 | Admitting: Obstetrics & Gynecology

## 2021-05-10 DIAGNOSIS — H5212 Myopia, left eye: Secondary | ICD-10-CM | POA: Diagnosis not present

## 2021-05-10 DIAGNOSIS — H52223 Regular astigmatism, bilateral: Secondary | ICD-10-CM | POA: Diagnosis not present

## 2021-05-10 DIAGNOSIS — Z961 Presence of intraocular lens: Secondary | ICD-10-CM | POA: Diagnosis not present

## 2021-05-10 DIAGNOSIS — M545 Low back pain, unspecified: Secondary | ICD-10-CM | POA: Diagnosis not present

## 2021-05-10 DIAGNOSIS — H524 Presbyopia: Secondary | ICD-10-CM | POA: Diagnosis not present

## 2021-05-19 ENCOUNTER — Other Ambulatory Visit (HOSPITAL_COMMUNITY): Payer: Self-pay

## 2021-05-24 ENCOUNTER — Other Ambulatory Visit (HOSPITAL_COMMUNITY): Payer: Self-pay

## 2021-05-24 ENCOUNTER — Encounter: Payer: Self-pay | Admitting: Obstetrics & Gynecology

## 2021-05-24 ENCOUNTER — Ambulatory Visit (INDEPENDENT_AMBULATORY_CARE_PROVIDER_SITE_OTHER): Payer: 59 | Admitting: Obstetrics & Gynecology

## 2021-05-24 VITALS — BP 112/80 | HR 78 | Ht 62.25 in | Wt 167.0 lb

## 2021-05-24 DIAGNOSIS — Z79890 Hormone replacement therapy (postmenopausal): Secondary | ICD-10-CM

## 2021-05-24 DIAGNOSIS — Z01419 Encounter for gynecological examination (general) (routine) without abnormal findings: Secondary | ICD-10-CM

## 2021-05-24 DIAGNOSIS — M545 Low back pain, unspecified: Secondary | ICD-10-CM | POA: Diagnosis not present

## 2021-05-24 MED ORDER — ESTRADIOL 0.05 MG/24HR TD PTWK
MEDICATED_PATCH | TRANSDERMAL | 4 refills | Status: DC
  Filled 2021-05-24: qty 12, 84d supply, fill #0
  Filled 2021-10-04: qty 12, 84d supply, fill #1
  Filled 2022-01-07: qty 12, 84d supply, fill #2
  Filled 2022-03-20 – 2022-03-21 (×3): qty 12, 84d supply, fill #3

## 2021-05-24 MED ORDER — PROGESTERONE MICRONIZED 100 MG PO CAPS
ORAL_CAPSULE | Freq: Every day | ORAL | 4 refills | Status: DC
  Filled 2021-05-24: qty 90, fill #0
  Filled 2021-08-10: qty 90, 90d supply, fill #0
  Filled 2021-11-14: qty 30, 30d supply, fill #1
  Filled 2021-12-14: qty 30, 30d supply, fill #2
  Filled 2022-01-14: qty 30, 30d supply, fill #3
  Filled 2022-02-16: qty 30, 30d supply, fill #4
  Filled 2022-03-20 – 2022-03-21 (×3): qty 30, 30d supply, fill #5
  Filled 2022-04-17: qty 30, 30d supply, fill #6
  Filled 2022-05-19: qty 30, 30d supply, fill #7

## 2021-05-24 NOTE — Progress Notes (Signed)
? ? ?Andrea Walsh 05-09-1965 [id number] ? ? ?History:    56 y.o. G47P4A0L5 Married.  S/P TL.  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  All children 75 yo and above, doing well.  3 grand-children, 1 on the way. ?  ?RP:  Established patient presenting for annual gyn exam  ?  ?HPI: Symptomatic menopause well on hormone replacement therapy since May 2019.  Taking estradiol 0.05 patch and Prometrium 100 mg at bedtime.  No postmenopausal bleeding.  No pelvic pain.  No pain with intercourse.  Pap Neg 02/2020.  No h/o abnormal Pap. Will repeat at 3 yrs.  Breasts normal s/p bilateral implants.   Mammo Neg 03/2020, repeat now. Urine and bowel movements normal.  BMI 30.3.  Health labs with family physician.  No Colono yet, recommend scheduling this year. ? ? ?Past medical history,surgical history, family history and social history were all reviewed and documented in the EPIC chart. ? ?Gynecologic History ?Patient's last menstrual period was 04/14/2017. ? ?Obstetric History ?OB History  ?Gravida Para Term Preterm AB Living  ?4 4     0 5  ?SAB IAB Ectopic Multiple Live Births  ?0   0 1    ?  ?# Outcome Date GA Lbr Len/2nd Weight Sex Delivery Anes PTL Lv  ?4 Para           ?3 Para           ?2 Para           ?1 Para           ? ? ? ?ROS: A ROS was performed and pertinent positives and negatives are included in the history. ? GENERAL: No fevers or chills. HEENT: No change in vision, no earache, sore throat or sinus congestion. NECK: No pain or stiffness. CARDIOVASCULAR: No chest pain or pressure. No palpitations. PULMONARY: No shortness of breath, cough or wheeze. GASTROINTESTINAL: No abdominal pain, nausea, vomiting or diarrhea, melena or bright red blood per rectum. GENITOURINARY: No urinary frequency, urgency, hesitancy or dysuria. MUSCULOSKELETAL: No joint or muscle pain, no back pain, no recent trauma. DERMATOLOGIC: No rash, no itching, no lesions. ENDOCRINE: No polyuria, polydipsia, no heat or cold intolerance. No recent  change in weight. HEMATOLOGICAL: No anemia or easy bruising or bleeding. NEUROLOGIC: No headache, seizures, numbness, tingling or weakness. PSYCHIATRIC: No depression, no loss of interest in normal activity or change in sleep pattern.  ?  ?Exam: ?BP 112/80   Pulse 78   Ht 5' 2.25" (1.581 m)   Wt 167 lb (75.8 kg)   LMP 04/14/2017   SpO2 94%   BMI 30.30 kg/m?  ? ?Body mass index is 30.3 kg/m?. ? ?General appearance : Well developed well nourished female. No acute distress ?HEENT: Eyes: no retinal hemorrhage or exudates,  Neck supple, trachea midline, no carotid bruits, no thyroidmegaly ?Lungs: Clear to auscultation, no rhonchi or wheezes, or rib retractions  ?Heart: Regular rate and rhythm, no murmurs or gallops ?Breast:Examined in sitting and supine position were symmetrical in appearance, no palpable masses or tenderness,  no skin retraction, no nipple inversion, no nipple discharge, no skin discoloration, no axillary or supraclavicular lymphadenopathy ?Abdomen: no palpable masses or tenderness, no rebound or guarding ?Extremities: no edema or skin discoloration or tenderness ? ?Pelvic: Vulva: Normal ?            Vagina: No gross lesions or discharge ? Cervix: No gross lesions or discharge ? Uterus  AV, normal size, shape and  consistency, non-tender and mobile ? Adnexa  Without masses or tenderness ? Anus: Normal ? ? ?Assessment/Plan:  56 y.o. female for annual exam  ? ?1. Well female exam with routine gynecological exam ?Symptomatic menopause well on hormone replacement therapy since May 2019.  Taking estradiol 0.05 patch and Prometrium 100 mg at bedtime.  No postmenopausal bleeding.  No pelvic pain. No pain with intercourse.  Pap Neg 02/2020.  No h/o abnormal Pap. Will repeat at 3 yrs.  Breasts normal s/p bilateral implants.   Mammo Neg 03/2020, repeat now. Urine and bowel movements normal.  BMI 30.3.  Health labs with family physician.  No Colono yet, recommend scheduling this year. ? ?2. Post-menopause on  HRT (hormone replacement therapy) ?Symptomatic menopause well on hormone replacement therapy since May 2019.  Taking estradiol 0.05 patch and Prometrium 100 mg at bedtime.  No postmenopausal bleeding.  No pelvic pain. No pain with intercourse. ? ?Other orders ?- progesterone (PROMETRIUM) 100 MG capsule; TAKE 1 CAPSULE BY MOUTH AT BEDTIME. ?- estradiol (CLIMARA - DOSED IN MG/24 HR) 0.05 mg/24hr patch; PLACE 1 PATCH (0.05 MG TOTAL) ONTO THE SKIN ONCE A WEEK.  ? ?[name] MD, 10:47 AM 05/24/2021 ? ?  ?

## 2021-05-25 ENCOUNTER — Other Ambulatory Visit (HOSPITAL_COMMUNITY): Payer: Self-pay

## 2021-06-01 ENCOUNTER — Other Ambulatory Visit (HOSPITAL_COMMUNITY): Payer: Self-pay

## 2021-06-02 ENCOUNTER — Other Ambulatory Visit (HOSPITAL_COMMUNITY): Payer: Self-pay

## 2021-06-02 MED ORDER — METHOCARBAMOL 500 MG PO TABS
ORAL_TABLET | ORAL | 3 refills | Status: DC
  Filled 2021-06-02: qty 60, 30d supply, fill #0
  Filled 2021-08-10: qty 60, 30d supply, fill #1
  Filled 2022-02-07: qty 60, 30d supply, fill #2

## 2021-06-19 ENCOUNTER — Other Ambulatory Visit (HOSPITAL_COMMUNITY): Payer: Self-pay

## 2021-06-28 ENCOUNTER — Other Ambulatory Visit (HOSPITAL_COMMUNITY): Payer: Self-pay

## 2021-06-28 DIAGNOSIS — Z6831 Body mass index (BMI) 31.0-31.9, adult: Secondary | ICD-10-CM | POA: Diagnosis not present

## 2021-06-28 DIAGNOSIS — G43009 Migraine without aura, not intractable, without status migrainosus: Secondary | ICD-10-CM | POA: Diagnosis not present

## 2021-06-28 DIAGNOSIS — F430 Acute stress reaction: Secondary | ICD-10-CM | POA: Diagnosis not present

## 2021-06-28 MED ORDER — ALPRAZOLAM 0.25 MG PO TABS
0.2500 mg | ORAL_TABLET | Freq: Every day | ORAL | 2 refills | Status: DC | PRN
  Filled 2021-06-28: qty 30, 30d supply, fill #0
  Filled 2021-07-31: qty 30, 30d supply, fill #1
  Filled 2021-09-06: qty 30, 30d supply, fill #2

## 2021-07-31 ENCOUNTER — Other Ambulatory Visit (HOSPITAL_COMMUNITY): Payer: Self-pay

## 2021-08-02 ENCOUNTER — Other Ambulatory Visit (HOSPITAL_COMMUNITY): Payer: Self-pay

## 2021-08-10 ENCOUNTER — Other Ambulatory Visit (HOSPITAL_COMMUNITY): Payer: Self-pay

## 2021-09-01 ENCOUNTER — Telehealth: Payer: Self-pay

## 2021-09-01 NOTE — Telephone Encounter (Signed)
Patient called. She said years ago you prescribed Valtrex 500 mg for her for HSV Type 1 -cold sores. She had the Rx for years and would use whenever she felt cold sore coming on. She is out of it now and would like to have it on hand.  Needs new Rx. Pharmacy confirmed.

## 2021-09-07 ENCOUNTER — Ambulatory Visit: Payer: 59 | Admitting: Dermatology

## 2021-09-07 ENCOUNTER — Other Ambulatory Visit (HOSPITAL_COMMUNITY): Payer: Self-pay

## 2021-09-07 NOTE — Telephone Encounter (Signed)
Patient called today to follow up on this call from last week. Routed to Dr. Marguerita Merles.

## 2021-09-14 DIAGNOSIS — H6121 Impacted cerumen, right ear: Secondary | ICD-10-CM | POA: Diagnosis not present

## 2021-09-15 ENCOUNTER — Other Ambulatory Visit (HOSPITAL_COMMUNITY): Payer: Self-pay

## 2021-09-15 MED ORDER — VALACYCLOVIR HCL 1 G PO TABS
ORAL_TABLET | ORAL | 3 refills | Status: DC
  Filled 2021-09-15: qty 30, 30d supply, fill #0

## 2021-09-15 NOTE — Telephone Encounter (Signed)
Andrea Bruins, MD  You 17 minutes ago (11:21 AM)   Agree to prescribe Valacyclovir 1 g PO daily x 5 days for recurrences.  #30 refill x 3.      Spoke with patient and informed her. Rx sent.

## 2021-09-17 ENCOUNTER — Other Ambulatory Visit (HOSPITAL_COMMUNITY): Payer: Self-pay

## 2021-09-17 DIAGNOSIS — H6121 Impacted cerumen, right ear: Secondary | ICD-10-CM | POA: Diagnosis not present

## 2021-09-17 DIAGNOSIS — H60331 Swimmer's ear, right ear: Secondary | ICD-10-CM | POA: Diagnosis not present

## 2021-09-17 DIAGNOSIS — H60311 Diffuse otitis externa, right ear: Secondary | ICD-10-CM | POA: Diagnosis not present

## 2021-09-17 MED ORDER — AMOXICILLIN 875 MG PO TABS
ORAL_TABLET | ORAL | 0 refills | Status: DC
  Filled 2021-09-17: qty 20, 10d supply, fill #0

## 2021-09-17 MED ORDER — OFLOXACIN 0.3 % OT SOLN
OTIC | 0 refills | Status: DC
  Filled 2021-09-17: qty 5, 7d supply, fill #0

## 2021-09-28 ENCOUNTER — Other Ambulatory Visit (HOSPITAL_COMMUNITY): Payer: Self-pay

## 2021-09-28 DIAGNOSIS — H9041 Sensorineural hearing loss, unilateral, right ear, with unrestricted hearing on the contralateral side: Secondary | ICD-10-CM | POA: Diagnosis not present

## 2021-09-28 DIAGNOSIS — H60501 Unspecified acute noninfective otitis externa, right ear: Secondary | ICD-10-CM | POA: Diagnosis not present

## 2021-09-28 DIAGNOSIS — H6123 Impacted cerumen, bilateral: Secondary | ICD-10-CM | POA: Diagnosis not present

## 2021-09-28 MED ORDER — HYDROCORTISONE-ACETIC ACID 1-2 % OT SOLN
OTIC | 2 refills | Status: AC
  Filled 2021-09-28 (×2): qty 10, 50d supply, fill #0

## 2021-10-04 ENCOUNTER — Other Ambulatory Visit (HOSPITAL_COMMUNITY): Payer: Self-pay

## 2021-10-05 ENCOUNTER — Other Ambulatory Visit (HOSPITAL_COMMUNITY): Payer: Self-pay

## 2021-10-05 DIAGNOSIS — E0590 Thyrotoxicosis, unspecified without thyrotoxic crisis or storm: Secondary | ICD-10-CM | POA: Diagnosis not present

## 2021-10-12 DIAGNOSIS — E0500 Thyrotoxicosis with diffuse goiter without thyrotoxic crisis or storm: Secondary | ICD-10-CM | POA: Diagnosis not present

## 2021-10-12 DIAGNOSIS — E0590 Thyrotoxicosis, unspecified without thyrotoxic crisis or storm: Secondary | ICD-10-CM | POA: Diagnosis not present

## 2021-10-12 DIAGNOSIS — K7689 Other specified diseases of liver: Secondary | ICD-10-CM | POA: Diagnosis not present

## 2021-10-25 ENCOUNTER — Other Ambulatory Visit (HOSPITAL_COMMUNITY): Payer: Self-pay

## 2021-11-15 ENCOUNTER — Other Ambulatory Visit (HOSPITAL_COMMUNITY): Payer: Self-pay

## 2021-11-18 ENCOUNTER — Other Ambulatory Visit (HOSPITAL_COMMUNITY): Payer: Self-pay

## 2021-11-24 ENCOUNTER — Other Ambulatory Visit (HOSPITAL_COMMUNITY): Payer: Self-pay

## 2021-12-09 DIAGNOSIS — Z13220 Encounter for screening for lipoid disorders: Secondary | ICD-10-CM | POA: Diagnosis not present

## 2021-12-09 DIAGNOSIS — Z0000 Encounter for general adult medical examination without abnormal findings: Secondary | ICD-10-CM | POA: Diagnosis not present

## 2021-12-14 ENCOUNTER — Other Ambulatory Visit (HOSPITAL_COMMUNITY): Payer: Self-pay

## 2021-12-15 ENCOUNTER — Other Ambulatory Visit (HOSPITAL_COMMUNITY): Payer: Self-pay

## 2021-12-15 ENCOUNTER — Other Ambulatory Visit: Payer: Self-pay | Admitting: Family Medicine

## 2021-12-15 DIAGNOSIS — Z0000 Encounter for general adult medical examination without abnormal findings: Secondary | ICD-10-CM

## 2021-12-15 DIAGNOSIS — E669 Obesity, unspecified: Secondary | ICD-10-CM | POA: Diagnosis not present

## 2021-12-15 DIAGNOSIS — F430 Acute stress reaction: Secondary | ICD-10-CM | POA: Diagnosis not present

## 2021-12-15 DIAGNOSIS — G43009 Migraine without aura, not intractable, without status migrainosus: Secondary | ICD-10-CM | POA: Diagnosis not present

## 2021-12-15 MED ORDER — NURTEC 75 MG PO TBDP
ORAL_TABLET | ORAL | 12 refills | Status: AC
  Filled 2021-12-15: qty 8, 8d supply, fill #0
  Filled 2021-12-20: qty 8, 30d supply, fill #0
  Filled 2022-02-07: qty 8, 30d supply, fill #1
  Filled 2022-03-29: qty 8, 30d supply, fill #2

## 2021-12-15 MED ORDER — ALPRAZOLAM 0.25 MG PO TABS
0.2500 mg | ORAL_TABLET | Freq: Every day | ORAL | 2 refills | Status: AC | PRN
  Filled 2021-12-15: qty 30, 30d supply, fill #0
  Filled 2022-02-07: qty 30, 30d supply, fill #1
  Filled 2022-04-01: qty 30, 30d supply, fill #2

## 2021-12-20 ENCOUNTER — Other Ambulatory Visit (HOSPITAL_COMMUNITY): Payer: Self-pay

## 2021-12-21 ENCOUNTER — Other Ambulatory Visit (HOSPITAL_COMMUNITY): Payer: Self-pay

## 2021-12-22 ENCOUNTER — Other Ambulatory Visit (HOSPITAL_COMMUNITY): Payer: Self-pay

## 2021-12-27 ENCOUNTER — Ambulatory Visit (HOSPITAL_COMMUNITY): Payer: 59

## 2021-12-27 ENCOUNTER — Encounter (HOSPITAL_COMMUNITY): Payer: Self-pay

## 2022-01-06 ENCOUNTER — Other Ambulatory Visit (HOSPITAL_COMMUNITY): Payer: Self-pay

## 2022-01-06 DIAGNOSIS — J019 Acute sinusitis, unspecified: Secondary | ICD-10-CM | POA: Diagnosis not present

## 2022-01-06 DIAGNOSIS — Z6831 Body mass index (BMI) 31.0-31.9, adult: Secondary | ICD-10-CM | POA: Diagnosis not present

## 2022-01-06 MED ORDER — FLUCONAZOLE 150 MG PO TABS
150.0000 mg | ORAL_TABLET | ORAL | 0 refills | Status: DC
  Filled 2022-01-06: qty 2, 8d supply, fill #0

## 2022-01-06 MED ORDER — AMOXICILLIN 500 MG PO CAPS
1500.0000 mg | ORAL_CAPSULE | Freq: Two times a day (BID) | ORAL | 1 refills | Status: AC
  Filled 2022-01-06: qty 30, 5d supply, fill #0

## 2022-01-07 ENCOUNTER — Other Ambulatory Visit: Payer: Self-pay

## 2022-01-07 ENCOUNTER — Other Ambulatory Visit (HOSPITAL_COMMUNITY): Payer: Self-pay

## 2022-01-10 ENCOUNTER — Other Ambulatory Visit (HOSPITAL_COMMUNITY): Payer: Self-pay

## 2022-01-25 ENCOUNTER — Other Ambulatory Visit (HOSPITAL_COMMUNITY): Payer: Self-pay

## 2022-01-25 DIAGNOSIS — R0602 Shortness of breath: Secondary | ICD-10-CM | POA: Diagnosis not present

## 2022-01-25 DIAGNOSIS — J101 Influenza due to other identified influenza virus with other respiratory manifestations: Secondary | ICD-10-CM | POA: Diagnosis not present

## 2022-01-25 DIAGNOSIS — R059 Cough, unspecified: Secondary | ICD-10-CM | POA: Diagnosis not present

## 2022-01-25 DIAGNOSIS — R52 Pain, unspecified: Secondary | ICD-10-CM | POA: Diagnosis not present

## 2022-01-25 MED ORDER — BENZONATATE 200 MG PO CAPS
200.0000 mg | ORAL_CAPSULE | Freq: Three times a day (TID) | ORAL | 0 refills | Status: DC | PRN
  Filled 2022-01-25: qty 30, 10d supply, fill #0

## 2022-01-25 MED ORDER — HYDROCODONE BIT-HOMATROP MBR 5-1.5 MG/5ML PO SOLN
5.0000 mL | Freq: Four times a day (QID) | ORAL | 0 refills | Status: DC | PRN
  Filled 2022-01-25: qty 100, 5d supply, fill #0

## 2022-01-25 MED ORDER — OSELTAMIVIR PHOSPHATE 75 MG PO CAPS
75.0000 mg | ORAL_CAPSULE | Freq: Two times a day (BID) | ORAL | 0 refills | Status: DC
  Filled 2022-01-25: qty 10, 5d supply, fill #0

## 2022-01-25 MED ORDER — ALBUTEROL SULFATE HFA 108 (90 BASE) MCG/ACT IN AERS
2.0000 | INHALATION_SPRAY | Freq: Four times a day (QID) | RESPIRATORY_TRACT | 0 refills | Status: DC | PRN
  Filled 2022-01-25: qty 6.7, 25d supply, fill #0

## 2022-02-07 ENCOUNTER — Other Ambulatory Visit (HOSPITAL_COMMUNITY): Payer: Self-pay

## 2022-02-08 ENCOUNTER — Other Ambulatory Visit: Payer: Self-pay

## 2022-02-21 ENCOUNTER — Other Ambulatory Visit (HOSPITAL_COMMUNITY): Payer: Self-pay

## 2022-03-21 ENCOUNTER — Other Ambulatory Visit (HOSPITAL_COMMUNITY): Payer: Self-pay

## 2022-03-21 ENCOUNTER — Other Ambulatory Visit: Payer: Self-pay

## 2022-04-01 ENCOUNTER — Other Ambulatory Visit: Payer: Self-pay

## 2022-04-06 IMAGING — MG DIGITAL SCREENING BREAST BILAT IMPLANT W/ TOMO W/ CAD
8 of 12 series · 8 of 28 positions shown · non-contrast
Comparison: Previous exam(s).

CLINICAL DATA: Screening.

EXAM:
DIGITAL SCREENING BILATERAL MAMMOGRAM WITH IMPLANTS, CAD AND
TOMOSYNTHESIS
TECHNIQUE: Bilateral screening digital craniocaudal and mediolateral oblique
mammograms were obtained. Bilateral screening digital breast
tomosynthesis was performed. The images were evaluated with
computer-aided detection. Standard and/or implant displaced views
were performed.

[R CC]
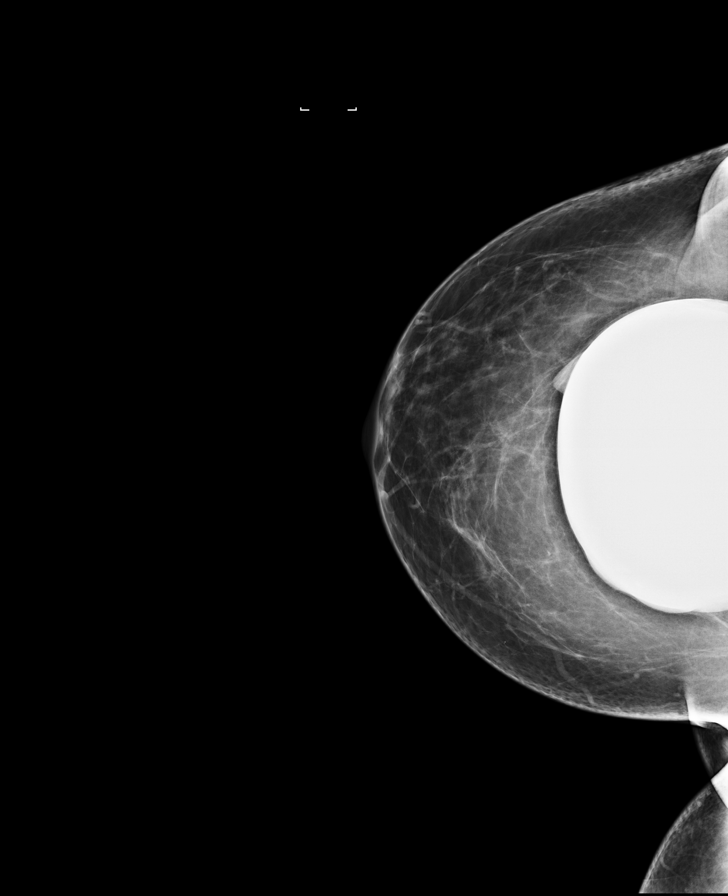

[L CC]
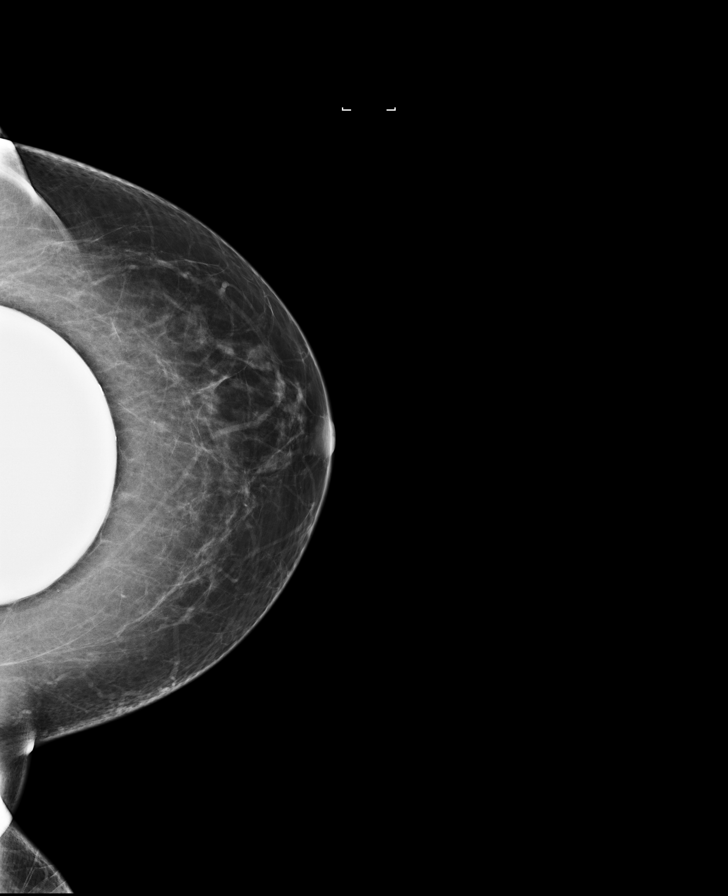

[L MLO]
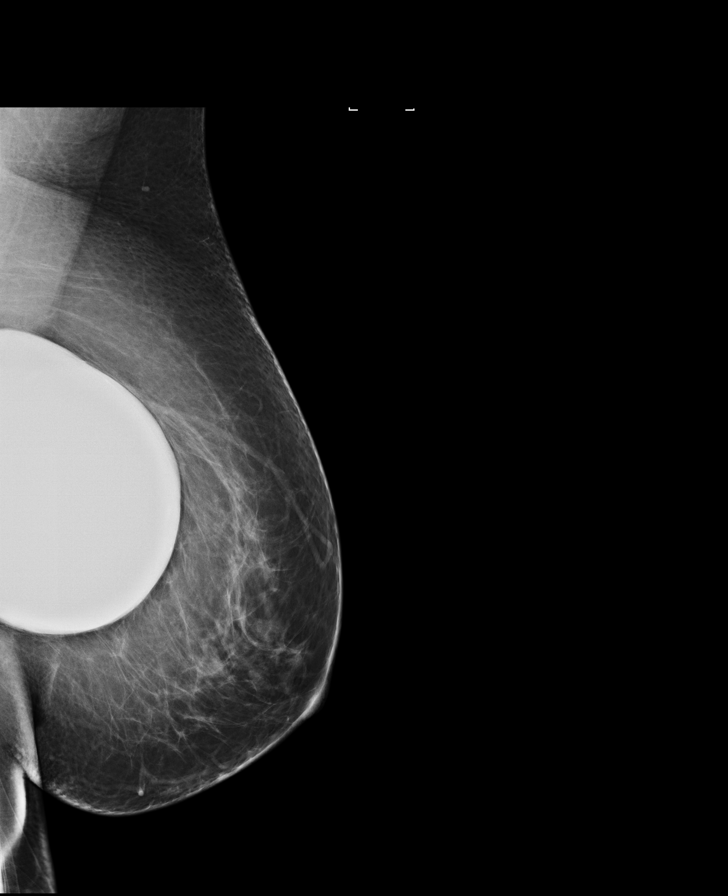

[R MLO]
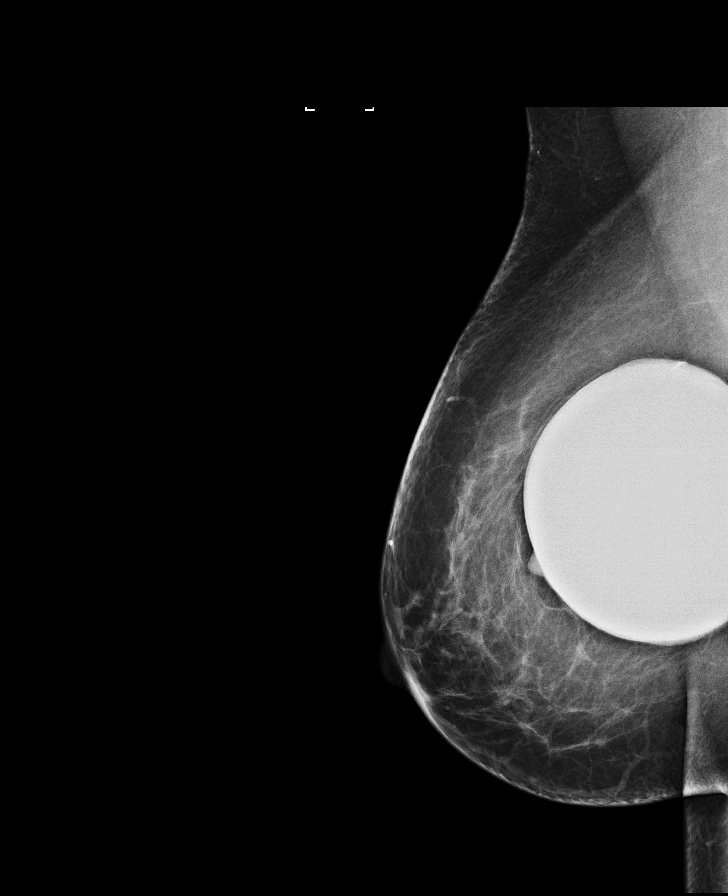

[L CC synth-2D]
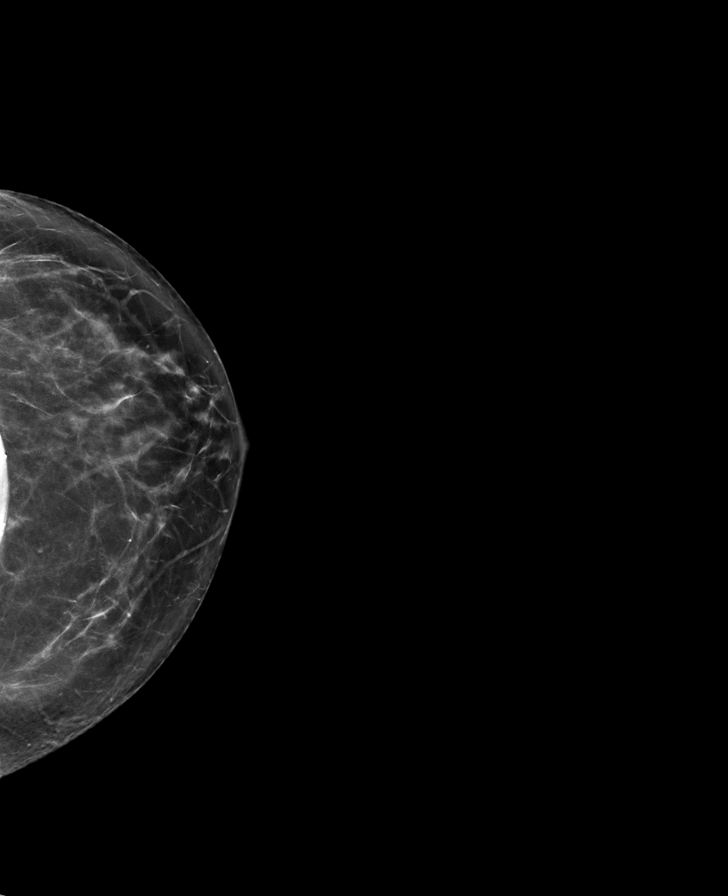

[L MLO synth-2D]
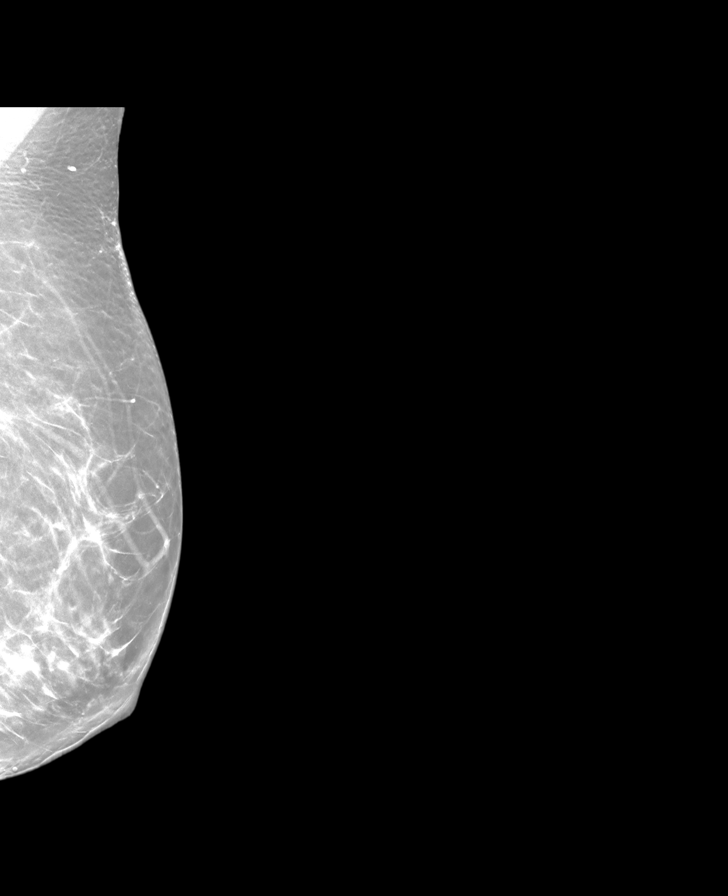

[R MLO synth-2D]
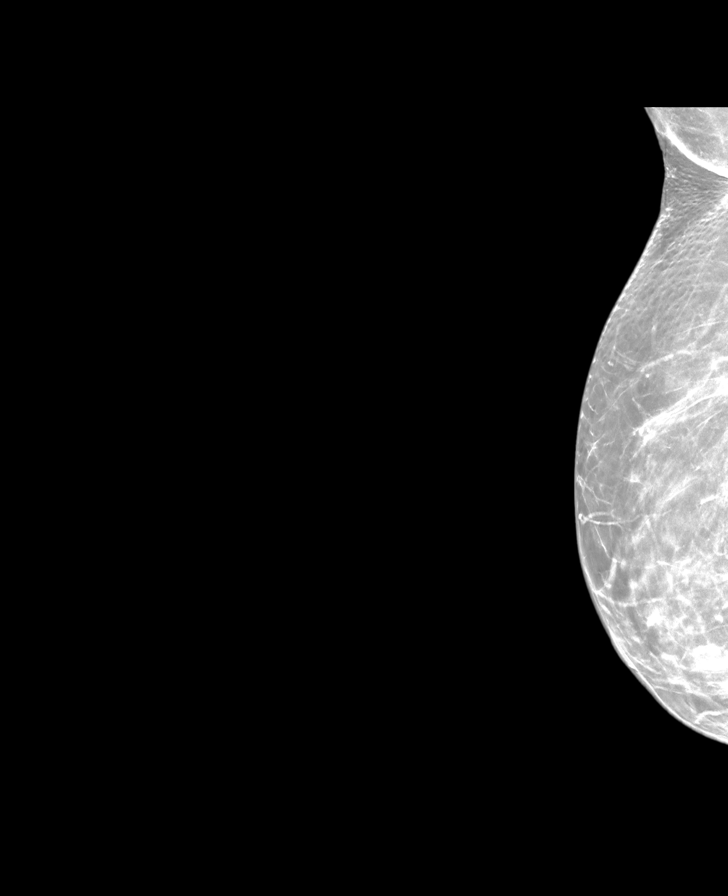

[R CC synth-2D]
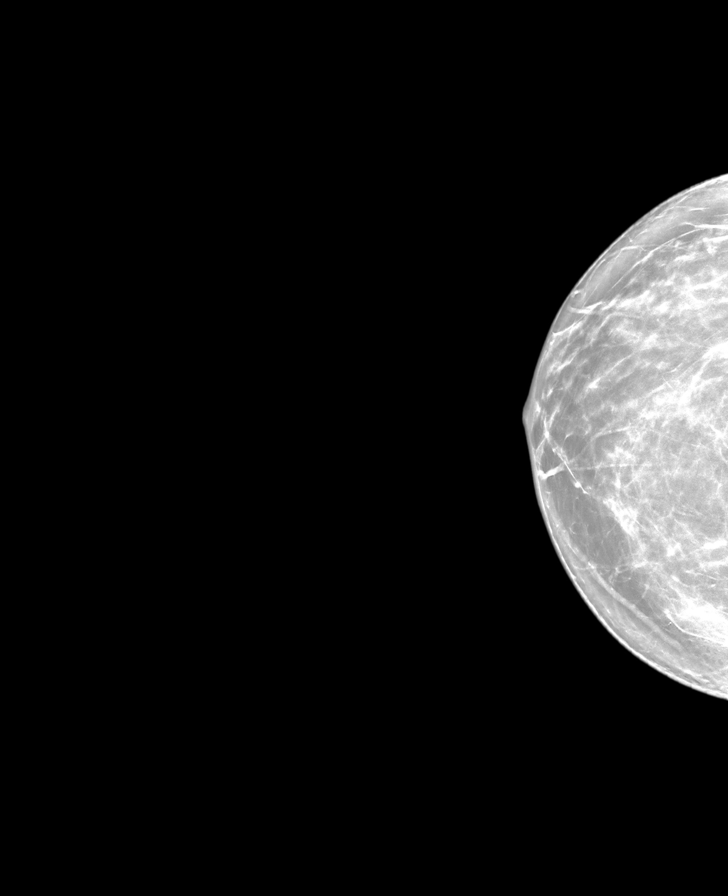

[8 of 28 positions shown; findings below may reference images not displayed]

ACR Breast Density Category b: There are scattered areas of
fibroglandular density.
FINDINGS: The patient has prepectoral implants. There are no findings
suspicious for malignancy.
IMPRESSION: No mammographic evidence of malignancy. A result letter of this
screening mammogram will be mailed directly to the patient.

RECOMMENDATION:
Screening mammogram in one year. (Code:TC-L-OXK)

BI-RADS CATEGORY  1:  Negative.

## 2022-04-18 ENCOUNTER — Other Ambulatory Visit (HOSPITAL_COMMUNITY): Payer: Self-pay

## 2022-05-19 ENCOUNTER — Other Ambulatory Visit (HOSPITAL_COMMUNITY): Payer: Self-pay

## 2022-05-20 ENCOUNTER — Other Ambulatory Visit (HOSPITAL_BASED_OUTPATIENT_CLINIC_OR_DEPARTMENT_OTHER): Payer: Self-pay

## 2022-05-20 ENCOUNTER — Other Ambulatory Visit (HOSPITAL_COMMUNITY): Payer: Self-pay

## 2022-07-21 ENCOUNTER — Other Ambulatory Visit: Payer: Self-pay

## 2022-07-21 NOTE — Telephone Encounter (Deleted)
Medication refill request: *** Last AEX:  *** Next AEX: *** Last MMG (if hormonal medication request): *** Refill authorized: ***  

## 2022-07-21 NOTE — Telephone Encounter (Signed)
Medication refill request: prometrium 100mg  Last AEX:  05-24-21 Next AEX: not scheduled Last MMG (if hormonal medication request): 03-31-20 birads 1:neg Refill authorized: pharmacy note placed pt must call to schedule exam for further refills. Please approve if appropriate

## 2022-07-22 ENCOUNTER — Other Ambulatory Visit: Payer: Self-pay | Admitting: Obstetrics & Gynecology

## 2022-07-22 DIAGNOSIS — Z0000 Encounter for general adult medical examination without abnormal findings: Secondary | ICD-10-CM

## 2022-07-22 MED ORDER — PROGESTERONE MICRONIZED 100 MG PO CAPS: ORAL_CAPSULE | Freq: Every day | ORAL | 0 refills | Status: DC

## 2022-07-22 NOTE — Telephone Encounter (Signed)
Patient states that she will call & schedule a mmg. She will let us know when her appointment is.

## 2022-07-22 NOTE — Telephone Encounter (Signed)
Patient called back & stated she called & scheduled her mmg appt with the breast center for 08-10-22 at 4:30 pm. Patient is asking if you can send in of her progesterone until then. Please approve if appropriate

## 2022-07-22 NOTE — Addendum Note (Signed)
Addended by: Eliezer Bottom on: 07/22/2022 02:41 PM   Modules accepted: Orders

## 2022-08-10 ENCOUNTER — Ambulatory Visit: Payer: Commercial Managed Care - PPO

## 2022-08-23 ENCOUNTER — Other Ambulatory Visit (HOSPITAL_COMMUNITY): Payer: Self-pay

## 2022-08-23 MED ORDER — RIMEGEPANT SULFATE 75 MG PO TBDP
75.0000 mg | ORAL_TABLET | ORAL | 12 refills | Status: DC | PRN
  Filled 2022-08-23: qty 8, 26d supply, fill #0
  Filled 2022-09-27: qty 8, 26d supply, fill #1
  Filled 2022-12-12: qty 8, 26d supply, fill #2

## 2022-08-23 MED ORDER — AMOXICILLIN 500 MG PO CAPS
1500.0000 mg | ORAL_CAPSULE | Freq: Two times a day (BID) | ORAL | 0 refills | Status: AC
  Filled 2022-08-23: qty 30, 5d supply, fill #0

## 2022-08-23 MED ORDER — ACETIC ACID 2 % OT SOLN
4.0000 [drp] | Freq: Every evening | OTIC | 2 refills | Status: DC
  Filled 2022-08-23: qty 15, 10d supply, fill #0

## 2022-08-24 ENCOUNTER — Other Ambulatory Visit: Payer: Self-pay | Admitting: *Deleted

## 2022-08-24 ENCOUNTER — Other Ambulatory Visit (HOSPITAL_COMMUNITY): Payer: Self-pay

## 2022-08-24 NOTE — Telephone Encounter (Signed)
Med refill request:  progesterone 100 mg cap PO daily at bedtime   and   Estradiol 0.05 mg patch once weekly   Last AEX: 05/24/21 -ML Next AEX: 09/23/22 -EB Last MMG (if hormonal med) 03/31/20, BiRads 1neg, scheduled for screening 09/09/22  Rx pended for 90 day supply/0RF  Refill authorized: Please Advise?

## 2022-08-25 ENCOUNTER — Other Ambulatory Visit (HOSPITAL_COMMUNITY): Payer: Self-pay

## 2022-08-25 MED ORDER — PROGESTERONE MICRONIZED 100 MG PO CAPS
100.0000 mg | ORAL_CAPSULE | Freq: Every day | ORAL | 0 refills | Status: DC
  Filled 2022-08-25: qty 90, 90d supply, fill #0

## 2022-08-25 MED ORDER — ESTRADIOL 0.05 MG/24HR TD PTWK
0.0500 mg | MEDICATED_PATCH | TRANSDERMAL | 0 refills | Status: DC
  Filled 2022-08-25: qty 12, 84d supply, fill #0

## 2022-08-26 ENCOUNTER — Other Ambulatory Visit (HOSPITAL_COMMUNITY): Payer: Self-pay

## 2022-09-09 ENCOUNTER — Ambulatory Visit
Admission: RE | Admit: 2022-09-09 | Discharge: 2022-09-09 | Disposition: A | Discharge status: Home / Self Care | Payer: Commercial Managed Care - PPO | Source: Ambulatory Visit | Attending: Obstetrics & Gynecology | Admitting: Obstetrics & Gynecology

## 2022-09-09 DIAGNOSIS — Z1231 Encounter for screening mammogram for malignant neoplasm of breast: Secondary | ICD-10-CM | POA: Diagnosis not present

## 2022-09-09 DIAGNOSIS — Z0000 Encounter for general adult medical examination without abnormal findings: Secondary | ICD-10-CM

## 2022-09-23 ENCOUNTER — Ambulatory Visit: Payer: Commercial Managed Care - PPO | Admitting: Obstetrics and Gynecology

## 2022-09-27 ENCOUNTER — Other Ambulatory Visit (HOSPITAL_COMMUNITY): Payer: Self-pay

## 2022-09-28 ENCOUNTER — Other Ambulatory Visit (HOSPITAL_COMMUNITY): Payer: Self-pay

## 2022-11-10 ENCOUNTER — Other Ambulatory Visit (HOSPITAL_COMMUNITY): Payer: Self-pay

## 2022-11-10 MED ORDER — METHOCARBAMOL 500 MG PO TABS
500.0000 mg | ORAL_TABLET | ORAL | 2 refills | Status: AC | PRN
  Filled 2022-11-10: qty 60, 10d supply, fill #0
  Filled 2023-05-30: qty 60, 10d supply, fill #1
  Filled 2023-08-08: qty 60, 10d supply, fill #2

## 2022-11-29 ENCOUNTER — Ambulatory Visit: Payer: Commercial Managed Care - PPO | Admitting: Obstetrics and Gynecology

## 2022-12-05 ENCOUNTER — Other Ambulatory Visit (HOSPITAL_COMMUNITY): Payer: Self-pay

## 2022-12-05 ENCOUNTER — Encounter: Payer: Self-pay | Admitting: Obstetrics and Gynecology

## 2022-12-05 ENCOUNTER — Ambulatory Visit (INDEPENDENT_AMBULATORY_CARE_PROVIDER_SITE_OTHER): Payer: Commercial Managed Care - PPO | Admitting: Obstetrics and Gynecology

## 2022-12-05 VITALS — BP 124/84 | HR 80 | Ht 61.75 in | Wt 172.0 lb

## 2022-12-05 DIAGNOSIS — Z01419 Encounter for gynecological examination (general) (routine) without abnormal findings: Secondary | ICD-10-CM

## 2022-12-05 DIAGNOSIS — Z1231 Encounter for screening mammogram for malignant neoplasm of breast: Secondary | ICD-10-CM

## 2022-12-05 DIAGNOSIS — Z1211 Encounter for screening for malignant neoplasm of colon: Secondary | ICD-10-CM

## 2022-12-05 MED ORDER — ESTRADIOL 0.05 MG/24HR TD PTWK
0.0500 mg | MEDICATED_PATCH | TRANSDERMAL | 0 refills | Status: DC
  Filled 2022-12-05: qty 12, 84d supply, fill #0

## 2022-12-05 MED ORDER — PROGESTERONE MICRONIZED 100 MG PO CAPS
100.0000 mg | ORAL_CAPSULE | Freq: Every day | ORAL | 3 refills | Status: DC
  Filled 2022-12-05: qty 90, 90d supply, fill #0
  Filled 2023-03-23: qty 90, 90d supply, fill #1
  Filled 2023-06-17: qty 90, 90d supply, fill #2
  Filled 2023-09-11: qty 90, 90d supply, fill #3

## 2022-12-05 NOTE — Progress Notes (Signed)
57 y.o. y.o. female here for annual exam. She denies any pm bleeding.   Patient's last menstrual period was 04/14/2017.   History:    57 y.o. G70P4A0L5 Married.  S/P TL.  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  All children 85 yo and above, doing well.  3 grand-children, 1 on the way.   Patient's last menstrual period was 04/14/2017.   HPI: Symptomatic menopause well on hormone replacement therapy since May 2019.  Taking estradiol 0.05 patch and Prometrium 100 mg at bedtime.  No postmenopausal bleeding.  No pelvic pain.  No pain with intercourse.  Pap Neg 02/2020.  No h/o abnormal Pap. Will repeat at 3 yrs.  Breasts normal s/p bilateral implants.   Mammo Neg 09/09/22. Urine and bowel movements normal.  Body mass index is 31.71 kg/m.   Health labs with family physician.  No Colono yet, recommend scheduling this year.   Past medical history,surgical history, family history and social history were all r  Last menstrual period 04/14/2017.  No results found for: "DIAGPAP", "HPVHIGH", "ADEQPAP"  GYN HISTORY: No results found for: "DIAGPAP", "HPVHIGH", "ADEQPAP"  OB History  Gravida Para Term Preterm AB Living  4 4     0 5  SAB IAB Ectopic Multiple Live Births  0   0 1      # Outcome Date GA Lbr Len/2nd Weight Sex Type Anes PTL Lv  4 Para           3 Para           2 Para           1 Para             No past medical history on file.  Past Surgical History:  Procedure Laterality Date   AUGMENTATION MAMMAPLASTY Bilateral    32 years ago   CESAREAN SECTION     EYE SURGERY     TUBAL LIGATION      Current Outpatient Medications on File Prior to Visit  Medication Sig Dispense Refill   acetic acid-hydrocortisone (VOSOL-HC) OTIC solution Place 4 drops in the right ear nightly for 1 week then as needed for itching and fullness. 10 mL 2   acetic acid 2 % otic solution Place 4 drops into the right ear nightly for 1 week, then if needed for itching and fullness. 15 mL 2   albuterol  (VENTOLIN HFA) 108 (90 Base) MCG/ACT inhaler Inhale 2 puffs into the lungs every 6 (six) hours as needed. 6.7 g 0   ALPRAZolam (XANAX) 0.25 MG tablet Take 1 tablet by mouth once daily as needed 30 tablet 2   ALPRAZolam (XANAX) 0.25 MG tablet Take 1 tablet by mouth daily as needed. 30 tablet 2   ALPRAZolam (XANAX) 0.25 MG tablet Take 1 tablet (0.25 mg total) by mouth daily as needed. 30 tablet 2   amoxicillin (AMOXIL) 875 MG tablet Take 1 tablet by mouth twice a day for 10 days 20 tablet 0   aspirin-acetaminophen-caffeine (EXCEDRIN MIGRAINE) 250-250-65 MG tablet Take 2 tablets by mouth every 6 (six) hours as needed for headache.     benzonatate (TESSALON) 200 MG capsule Take 1 capsule (200 mg total) by mouth 3 (three) times daily as needed for cough for 10 days 30 capsule 0   estradiol (CLIMARA - DOSED IN MG/24 HR) 0.05 mg/24hr patch Place 1 patch (0.05 mg total) onto the skin once a week. 12 patch 0   fluconazole (DIFLUCAN) 150  MG tablet Take 1 tablet (150 mg total) by mouth now, then take 1 tablet in a week. 2 tablet 0   HYDROcodone bit-homatropine (HYDROMET) 5-1.5 MG/5ML syrup Take 5 mLs by mouth every 6 (six) hours as needed for cough for 5 days 100 mL 0   methocarbamol (ROBAXIN) 500 MG tablet Take 1 tablet (500 mg total) by mouth 2 (two) times daily as needed for muscle spasms. 20 tablet 0   methocarbamol (ROBAXIN) 500 MG tablet Take 1 tablet by mouth every 4 to 8 hours (30 day supply) 60 tablet 3   methocarbamol (ROBAXIN) 500 MG tablet Take 1 tablet (500 mg total) by mouth every 4 (four) hours as needed. 60 tablet 2   Multiple Vitamin (MULTIVITAMIN) tablet Take 1 tablet by mouth daily.     ofloxacin (FLOXIN) 0.3 % OTIC solution Apply 10 drops into affected ear Once a day for 7 days 5 mL 0   Omega-3 Fatty Acids (OMEGA 3 500 PO) Take by mouth.     oseltamivir (TAMIFLU) 75 MG capsule Take 1 capsule (75 mg total) by mouth 2 (two) times daily for 5 days 10 capsule 0   progesterone (PROMETRIUM) 100  MG capsule Take 1 capsule (100 mg total) by mouth at bedtime. 90 capsule 0   Rimegepant Sulfate (NURTEC) 75 MG TBDP Dissolve 1 tablet on the tongue for migraine as needed. Max 1 per day. 8 tablet 12   Rimegepant Sulfate 75 MG TBDP Dissolve 1 tablet (75 mg total) on tongue daily as needed. Max 1 tablet per day. 8 tablet 12   valACYclovir (VALTREX) 1000 MG tablet Take one tablet by mouth daily x 5 days with recurrences. 30 tablet 3   zolmitriptan (ZOMIG) 5 MG nasal solution USE AS DIRECTED AS NEEDED FOR MIGRAINE 24 each 3   No current facility-administered medications on file prior to visit.    Social History   Socioeconomic History   Marital status: Married    Spouse name: Not on file   Number of children: Not on file   Years of education: Not on file   Highest education level: Not on file  Occupational History   Not on file  Tobacco Use   Smoking status: Never   Smokeless tobacco: Never  Vaping Use   Vaping status: Never Used  Substance and Sexual Activity   Alcohol use: Yes    Comment: rare   Drug use: No   Sexual activity: Not Currently    Partners: Male    Birth control/protection: Surgical    Comment: 1st intercourse- 19, partners- 2 , married- 50yrs   Other Topics Concern   Not on file  Social History Narrative   Not on file   Social Determinants of Health   Financial Resource Strain: Not on File (04/23/2021)   Received from [employer], [employer]    Financial Resource Strain: 0  Food Insecurity: Not on File (04/28/2021)   Received from [place], [place]   Food Insecurity    Food: 0  Transportation Needs: Not on File (04/28/2021)   Received from [employer] Needs    Transportation: 0  Physical Activity: Not on File (04/23/2021)   Received from [place], [place]   Physical Activity    Physical Activity: 0  Stress: Not on File (04/23/2021)   Received from [facility], [place]   Stress    Stress: 0  Social Connections: Not on File (10/03/2022)    Received from [employer]  Connectedness: 0  Intimate Partner Violence: Not on file    Family History  Problem Relation Age of Onset   Cancer Father        lung     Allergies  Allergen Reactions   Morphine And Codeine Nausea And Vomiting   Pyridium [Phenazopyridine Hcl] Rash      Patient's last menstrual period was Patient's last menstrual period was 04/14/2017.Marland Kitchen            Review of Systems Alls systems reviewed and are negative.     Physical Exam Constitutional:      Appearance: Normal appearance.  Genitourinary:     Vulva and urethral meatus normal.     No lesions in the vagina.     Genitourinary Comments: Stage 1 cytocele     Right Labia: No rash, lesions or skin changes.    Left Labia: No lesions, skin changes or rash.    No vaginal discharge or tenderness.     Anterior vaginal prolapse present.    No vaginal atrophy present.     Right Adnexa: not tender, not palpable and no mass present.    Left Adnexa: not tender, not palpable and no mass present.    No cervical motion tenderness or discharge.     Uterus is not enlarged, tender or irregular.     Uterus is anteverted.  Breasts:    Right: Normal.     Left: Normal.  HENT:     Head: Normocephalic.  Neck:     Thyroid: No thyroid mass, thyromegaly or thyroid tenderness.  Cardiovascular:     Rate and Rhythm: Normal rate and regular rhythm.     Heart sounds: Normal heart sounds, S1 normal and S2 normal.  Pulmonary:     Effort: Pulmonary effort is normal.     Breath sounds: Normal breath sounds and air entry.  Abdominal:     General: There is no distension.     Palpations: Abdomen is soft. There is no mass.     Tenderness: There is no abdominal tenderness. There is no guarding or rebound.  Musculoskeletal:        General: Normal range of motion.     Cervical back: Full passive range of motion without pain, normal range of motion and neck supple. No tenderness.     Right lower leg: No  edema.     Left lower leg: No edema.  Neurological:     Mental Status: She is alert.  Skin:    General: Skin is warm.  Psychiatric:        Mood and Affect: Mood normal.        Behavior: Behavior normal.        Thought Content: Thought content normal.  Vitals and nursing note reviewed. Exam conducted with a chaperone present.       A:         Well Woman GYN exam                             P:        Pap smear not indicated q 3 years Encouraged annual mammogram screening Colon cancer screening referral placed today DXA not indicated Labs and immunizations to do with PMD Discussed breast self exams Encouraged healthy lifestyle practices Encouraged Vit D and Calcium  Stage 1 cystocele. No GU symptoms does not feel a bulge.  Discussed PT and kegel exercises.   No follow-ups on  file.  Earley Favor

## 2022-12-12 ENCOUNTER — Other Ambulatory Visit (HOSPITAL_COMMUNITY): Payer: Self-pay

## 2022-12-13 DIAGNOSIS — Z13220 Encounter for screening for lipoid disorders: Secondary | ICD-10-CM | POA: Diagnosis not present

## 2022-12-13 DIAGNOSIS — Z0000 Encounter for general adult medical examination without abnormal findings: Secondary | ICD-10-CM | POA: Diagnosis not present

## 2022-12-13 LAB — CBC AND DIFFERENTIAL
HCT: 41 (ref 36–46)
Hemoglobin: 14.1 (ref 12.0–16.0)
Platelets: 275 10*3/uL (ref 150–400)
WBC: 6.2

## 2022-12-13 LAB — LIPID PANEL
Cholesterol: 234 — AB (ref 0–200)
HDL: 50 (ref 35–70)
LDL Cholesterol: 161
LDl/HDL Ratio: 4.7
Triglycerides: 130 (ref 40–160)

## 2022-12-13 LAB — BASIC METABOLIC PANEL WITH GFR
BUN: 13 (ref 4–21)
CO2: 32 — AB (ref 13–22)
Chloride: 105 (ref 99–108)
Creatinine: 0.8 (ref 0.5–1.1)
Glucose: 96
Potassium: 5 meq/L (ref 3.5–5.1)
Sodium: 141 (ref 137–147)

## 2022-12-13 LAB — COMPREHENSIVE METABOLIC PANEL WITH GFR
Albumin: 4.4 (ref 3.5–5.0)
Calcium: 9.8 (ref 8.7–10.7)
eGFR: 94

## 2022-12-13 LAB — CBC: RBC: 4.2 (ref 3.87–5.11)

## 2022-12-13 LAB — HEPATIC FUNCTION PANEL
ALT: 38 U/L — AB (ref 7–35)
AST: 21 (ref 13–35)
Alkaline Phosphatase: 74 (ref 25–125)

## 2022-12-26 ENCOUNTER — Other Ambulatory Visit (HOSPITAL_COMMUNITY): Payer: Self-pay | Admitting: Family Medicine

## 2022-12-26 ENCOUNTER — Other Ambulatory Visit (HOSPITAL_COMMUNITY): Payer: Self-pay

## 2022-12-26 DIAGNOSIS — Z0000 Encounter for general adult medical examination without abnormal findings: Secondary | ICD-10-CM

## 2022-12-26 DIAGNOSIS — G43009 Migraine without aura, not intractable, without status migrainosus: Secondary | ICD-10-CM | POA: Diagnosis not present

## 2022-12-26 DIAGNOSIS — E7800 Pure hypercholesterolemia, unspecified: Secondary | ICD-10-CM | POA: Diagnosis not present

## 2022-12-26 DIAGNOSIS — F430 Acute stress reaction: Secondary | ICD-10-CM | POA: Diagnosis not present

## 2022-12-26 DIAGNOSIS — Z6831 Body mass index (BMI) 31.0-31.9, adult: Secondary | ICD-10-CM | POA: Diagnosis not present

## 2022-12-26 DIAGNOSIS — M549 Dorsalgia, unspecified: Secondary | ICD-10-CM | POA: Diagnosis not present

## 2022-12-26 DIAGNOSIS — Z23 Encounter for immunization: Secondary | ICD-10-CM | POA: Diagnosis not present

## 2022-12-26 MED ORDER — METHOCARBAMOL 500 MG PO TABS
500.0000 mg | ORAL_TABLET | ORAL | 2 refills | Status: DC | PRN
  Filled 2022-12-26: qty 60, 10d supply, fill #0
  Filled 2023-03-24: qty 60, 10d supply, fill #1
  Filled 2023-05-10: qty 60, 10d supply, fill #2

## 2022-12-26 MED ORDER — NURTEC 75 MG PO TBDP
75.0000 mg | ORAL_TABLET | ORAL | 12 refills | Status: DC | PRN
  Filled 2022-12-26: qty 8, 30d supply, fill #0
  Filled 2023-02-10: qty 8, 26d supply, fill #0
  Filled 2023-04-03: qty 8, 26d supply, fill #1
  Filled 2023-05-10: qty 8, 26d supply, fill #2
  Filled 2023-06-19: qty 8, 26d supply, fill #3
  Filled 2023-08-08: qty 8, 26d supply, fill #4
  Filled 2023-09-11: qty 8, 26d supply, fill #5
  Filled 2023-11-06: qty 8, 26d supply, fill #6

## 2022-12-26 MED ORDER — ALPRAZOLAM 0.25 MG PO TABS
0.2500 mg | ORAL_TABLET | Freq: Every day | ORAL | 5 refills | Status: DC | PRN
Start: 2022-12-26 — End: 2023-07-04
  Filled 2022-12-26: qty 30, 30d supply, fill #0
  Filled 2023-02-10: qty 30, 30d supply, fill #1
  Filled 2023-03-24: qty 30, 30d supply, fill #2
  Filled 2023-05-10: qty 30, 30d supply, fill #3
  Filled 2023-06-15: qty 30, 30d supply, fill #4

## 2022-12-30 ENCOUNTER — Other Ambulatory Visit (HOSPITAL_COMMUNITY): Payer: Self-pay

## 2023-01-03 ENCOUNTER — Ambulatory Visit (HOSPITAL_COMMUNITY)
Admission: RE | Admit: 2023-01-03 | Discharge: 2023-01-03 | Disposition: A | Discharge status: Home / Self Care | Payer: Commercial Managed Care - PPO | Source: Ambulatory Visit | Attending: Family Medicine | Admitting: Family Medicine

## 2023-01-03 DIAGNOSIS — Z0000 Encounter for general adult medical examination without abnormal findings: Secondary | ICD-10-CM | POA: Insufficient documentation

## 2023-01-31 DIAGNOSIS — E0590 Thyrotoxicosis, unspecified without thyrotoxic crisis or storm: Secondary | ICD-10-CM | POA: Diagnosis not present

## 2023-02-07 DIAGNOSIS — K7689 Other specified diseases of liver: Secondary | ICD-10-CM | POA: Diagnosis not present

## 2023-02-07 DIAGNOSIS — E0500 Thyrotoxicosis with diffuse goiter without thyrotoxic crisis or storm: Secondary | ICD-10-CM | POA: Diagnosis not present

## 2023-02-07 DIAGNOSIS — E0590 Thyrotoxicosis, unspecified without thyrotoxic crisis or storm: Secondary | ICD-10-CM | POA: Diagnosis not present

## 2023-02-10 ENCOUNTER — Other Ambulatory Visit: Payer: Self-pay

## 2023-02-10 ENCOUNTER — Other Ambulatory Visit (HOSPITAL_COMMUNITY): Payer: Self-pay

## 2023-03-03 ENCOUNTER — Other Ambulatory Visit: Payer: Self-pay | Admitting: Obstetrics and Gynecology

## 2023-03-03 ENCOUNTER — Other Ambulatory Visit (HOSPITAL_COMMUNITY): Payer: Self-pay

## 2023-03-03 ENCOUNTER — Other Ambulatory Visit: Payer: Self-pay | Admitting: *Deleted

## 2023-03-03 MED ORDER — ESTRADIOL 0.05 MG/24HR TD PTWK
0.0500 mg | MEDICATED_PATCH | TRANSDERMAL | 2 refills | Status: DC
  Filled 2023-03-03: qty 12, 84d supply, fill #0
  Filled 2023-05-26: qty 12, 84d supply, fill #1
  Filled 2023-08-08: qty 12, 84d supply, fill #2

## 2023-03-03 NOTE — Telephone Encounter (Signed)
 Andrea Walsh Andrea Walsh Andrea Walsh Andrea Walsh [facility] Triage; Brutus Kate SAILOR, RN The pt LM wanting someone to call her about a refill. She said they were supposed to send it in after her annual for a year. But the pharmacy told her she didn't have any refills. She will be out after today. Her number is [phone]. If someone could give her  a call please :)  Thank you Andrea Walsh Sermon with patient. Prometrium  100 mg #90/3RF sent on 12/09/22. Estradiol  0.05 mg patch  weekly sent on 12/05/22, #12/0RF. Reviewed AEX notes. Confirmed pharmacy on file. Additional refills of Estradiol  0.05 mg patch sent, #12/2RF.   Routing to provider for final review. Patient is agreeable to disposition. Will close encounter.

## 2023-03-03 NOTE — Telephone Encounter (Signed)
 Med refill request:Climara  0.05mg /24hr patch  Last AEX: 12/05/22 Next AEX:not scheduled  Last MMG (if hormonal med) 09/09/22 birads cat 1 neg Refill authorized: Last rx 12/05/22 12 patch 0 refills. Please approve or deny as appropriate.

## 2023-03-03 NOTE — Telephone Encounter (Signed)
 See refill encounter dated 03/03/23.   Encounter closed.

## 2023-03-24 ENCOUNTER — Other Ambulatory Visit (HOSPITAL_COMMUNITY): Payer: Self-pay

## 2023-04-03 ENCOUNTER — Other Ambulatory Visit (HOSPITAL_COMMUNITY): Payer: Self-pay

## 2023-04-05 ENCOUNTER — Ambulatory Visit (INDEPENDENT_AMBULATORY_CARE_PROVIDER_SITE_OTHER): Admitting: Internal Medicine

## 2023-04-05 VITALS — BP 128/82 | HR 75 | Temp 98.0°F | Ht 62.0 in | Wt 169.0 lb

## 2023-04-05 DIAGNOSIS — E785 Hyperlipidemia, unspecified: Secondary | ICD-10-CM | POA: Insufficient documentation

## 2023-04-05 DIAGNOSIS — Z0289 Encounter for other administrative examinations: Secondary | ICD-10-CM

## 2023-04-05 DIAGNOSIS — E66811 Obesity, class 1: Secondary | ICD-10-CM | POA: Diagnosis not present

## 2023-04-05 DIAGNOSIS — Z6830 Body mass index (BMI) 30.0-30.9, adult: Secondary | ICD-10-CM

## 2023-04-05 NOTE — Progress Notes (Signed)
 Office: [phone]  /  Fax: [phone]   Initial Visit  Andrea Walsh was seen in clinic today to evaluate for obesity. She is interested in losing weight to improve overall health and reduce the risk of weight related complications. She presents today to review program treatment options, initial physical assessment, and evaluation.     She was referred by: Friend or Family  When asked what else they would like to accomplish? She states: Adopt healthier eating patterns, Improve energy levels and physical activity, and Improve quality of life  Weight history: Gradual, last 10 years, 3-5 years  When asked how has your weight affected you? She states: Having fatigue and Having poor endurance  Some associated conditions: Hyperlipidemia  Contributing factors: Family history of obesity, Use of obesogenic medications: Contraceptives or hormonal therapy, Moderate to high levels of stress, and Menopause  Weight promoting medications identified: Contraceptives or hormonal therapy  Current nutrition plan: None  Current level of physical activity: Walking 30-60 minutes and NEAT  Current or previous pharmacotherapy: None  Response to medication: Never tried medications   Past medical history includes:   Past Medical History:  Diagnosis Date   Abnormal Pap smear of cervix    age 58   Graves disease    in remission   HSV-1 infection    oral cold sores   Migraines      Objective:   BP 128/82   Pulse 75   Temp 98 F (36.7 C)   Ht 5\' 2"  (1.575 m)   Wt 169 lb (76.7 kg)   LMP 04/14/2017   SpO2 97%   BMI 30.91 kg/m  She was weighed on the bioimpedance scale: Body mass index is 30.91 kg/m.  Peak Weight:169 , Body Fat%:36, Visceral Fat Rating:9, Weight trend over the last 12 months: Increasing  General:  Alert, oriented and cooperative. Patient is in no acute distress.  Respiratory: Normal respiratory effort, no problems with respiration noted   Gait: able to ambulate  independently  Mental Status: Normal mood and affect. Normal behavior. Normal judgment and thought content.   DIAGNOSTIC DATA REVIEWED:  BMET No results found for: "NA", "K", "CL", "CO2", "GLUCOSE", "BUN", "CREATININE", "CALCIUM", "GFRNONAA", "GFRAA" No results found for: "HGBA1C" No results found for: "INSULIN" CBC No results found for: "WBC", "RBC", "HGB", "HCT", "PLT", "MCV", "MCH", "MCHC", "RDW" Iron/TIBC/Ferritin/ %Sat No results found for: "IRON", "TIBC", "FERRITIN", "IRONPCTSAT" Lipid Panel  No results found for: "CHOL", "TRIG", "HDL", "CHOLHDL", "VLDL", "LDLCALC", "LDLDIRECT" Hepatic Function Panel  No results found for: "PROT", "ALBUMIN", "AST", "ALT", "ALKPHOS", "BILITOT", "BILIDIR", "IBILI" No results found for: "TSH"   Assessment and Plan:   Dyslipidemia Assessment & Plan: I was not able to download labs from [facility] today.  She reports having an elevated LDL cholesterol in the past.  She underwent coronary artery calcium scoring and had a 0 she therefore opted to proceed with therapeutic lifestyle changes.  For the most part she avoids saturated fats in her diet.  Losing 10% of body weight may improve LDL values.  This may also be genetic.  Patient will also be evaluated for glycemic disorders including insulin resistance.   Class 1 obesity with serious comorbidity and body mass index (BMI) of 30.0 to 30.9 in adult, unspecified obesity type Assessment & Plan: We reviewed anthropometrics, biometrics, associated medical conditions and contributing factors with patient. she would benefit from a medically tailored reduced calorie nutrional plan based on her REE (resting energy expenditure), which will be determined by  indirect calorimetry.  We will also assess for cardiometabolic risk and nutritional derangements via fasting labs at intake appointment.           Obesity Treatment / Action Plan:  Patient will work on garnering support from family and friends to  begin weight loss journey. Will work on eliminating or reducing the presence of highly palatable, calorie dense foods in the home. Will complete provided nutritional and psychosocial assessment questionnaire before the next appointment. Will be scheduled for indirect calorimetry to determine resting energy expenditure in a fasting state.  This will allow [place] to create a reduced calorie, high-protein meal plan to promote loss of fat mass while preserving muscle mass. Counseled on the health benefits of losing 5%-15% of total body weight. Was counseled on nutritional approaches to weight loss and benefits of reducing processed foods and consuming plant-based foods and high quality protein as part of nutritional weight management. Was counseled on pharmacotherapy and role as an adjunct in weight management.   Obesity Education Performed Today:  She was weighed on the bioimpedance scale and results were discussed and documented in the synopsis.  We discussed obesity as a disease and the importance of a more detailed evaluation of all the factors contributing to the disease.  We discussed the importance of long term lifestyle changes which include nutrition, exercise and behavioral modifications as well as the importance of customizing this to her specific health and social needs.  We discussed the benefits of reaching a healthier weight to alleviate the symptoms of existing conditions and reduce the risks of the biomechanical, metabolic and psychological effects of obesity.  Andrea Walsh appears to be in the action stage of change and states they are ready to start intensive lifestyle modifications and behavioral modifications.  30 minutes was spent today on this visit including the above counseling, pre-visit chart review, and post-visit documentation.  Reviewed by clinician on day of visit: allergies, medications, problem list, medical history, surgical history, family history, social history,  and previous encounter notes pertinent to obesity diagnosis.   Worthy Rancher, MD

## 2023-04-05 NOTE — Assessment & Plan Note (Signed)
 We reviewed anthropometrics, biometrics, associated medical conditions and contributing factors with patient. she would benefit from a medically tailored reduced calorie nutrional plan based on her REE (resting energy expenditure), which will be determined by indirect calorimetry.  We will also assess for cardiometabolic risk and nutritional derangements via fasting labs at intake appointment.

## 2023-04-05 NOTE — Assessment & Plan Note (Addendum)
 I was not able to download labs from [facility] today.  She reports having an elevated LDL cholesterol in the past.  She underwent coronary artery calcium scoring and had a 0 she therefore opted to proceed with therapeutic lifestyle changes.  For the most part she avoids saturated fats in her diet.  Losing 10% of body weight may improve LDL values.  This may also be genetic.  Patient will also be evaluated for glycemic disorders including insulin resistance.

## 2023-04-24 ENCOUNTER — Other Ambulatory Visit (HOSPITAL_COMMUNITY): Payer: Self-pay

## 2023-04-24 ENCOUNTER — Other Ambulatory Visit: Payer: Self-pay | Admitting: Obstetrics & Gynecology

## 2023-04-25 ENCOUNTER — Other Ambulatory Visit (HOSPITAL_COMMUNITY): Payer: Self-pay

## 2023-04-25 MED ORDER — VALACYCLOVIR HCL 1 G PO TABS
ORAL_TABLET | ORAL | 3 refills | Status: AC
  Filled 2023-04-25: qty 30, 5d supply, fill #0
  Filled 2023-09-30: qty 30, 5d supply, fill #1
  Filled 2023-11-06: qty 30, 5d supply, fill #2
  Filled 2024-02-14: qty 30, 5d supply, fill #3

## 2023-04-25 NOTE — Telephone Encounter (Signed)
 Med refill request: Valtrex  Last AEX: 12/05/2022-EB Next AEX: nothing currently, recall placed for 2025.  Last MMG (if hormonal med): n/a Refill authorized: rx pend.   Pt also LVM in med refill line requesting the refill.

## 2023-05-04 ENCOUNTER — Encounter (INDEPENDENT_AMBULATORY_CARE_PROVIDER_SITE_OTHER): Payer: Self-pay | Admitting: Internal Medicine

## 2023-05-04 ENCOUNTER — Ambulatory Visit (INDEPENDENT_AMBULATORY_CARE_PROVIDER_SITE_OTHER): Admitting: Internal Medicine

## 2023-05-04 VITALS — BP 116/78 | HR 71 | Temp 97.9°F | Ht 62.0 in | Wt 166.0 lb

## 2023-05-04 DIAGNOSIS — R5383 Other fatigue: Secondary | ICD-10-CM | POA: Diagnosis not present

## 2023-05-04 DIAGNOSIS — Z1331 Encounter for screening for depression: Secondary | ICD-10-CM | POA: Diagnosis not present

## 2023-05-04 DIAGNOSIS — E785 Hyperlipidemia, unspecified: Secondary | ICD-10-CM | POA: Diagnosis not present

## 2023-05-04 DIAGNOSIS — Z6830 Body mass index (BMI) 30.0-30.9, adult: Secondary | ICD-10-CM

## 2023-05-04 DIAGNOSIS — R0602 Shortness of breath: Secondary | ICD-10-CM | POA: Diagnosis not present

## 2023-05-04 DIAGNOSIS — K760 Fatty (change of) liver, not elsewhere classified: Secondary | ICD-10-CM

## 2023-05-04 DIAGNOSIS — E0500 Thyrotoxicosis with diffuse goiter without thyrotoxic crisis or storm: Secondary | ICD-10-CM

## 2023-05-04 DIAGNOSIS — E66811 Obesity, class 1: Secondary | ICD-10-CM

## 2023-05-04 NOTE — Progress Notes (Signed)
 [address] [place],  [place], [place] [address]  Office: [phone]  /  Fax: [phone]   Subjective   Initial Visit  Andrea Walsh (MR# [id number]) is a 59 y.o. female who presents for evaluation and treatment of obesity and related comorbidities. Current BMI is Body mass index is 30.36 kg/m. Andrea Walsh has been struggling with her weight for many years and has been unsuccessful in either losing weight, maintaining weight loss, or reaching her healthy weight goal.  Andrea Walsh is currently in the action stage of change and ready to dedicate time achieving and maintaining a healthier weight. Andrea Walsh is interested in becoming our patient and working on intensive lifestyle modifications including (but not limited to) diet and exercise for weight loss.  She was referred by: Friend or Family   When asked what else they would like to accomplish? She states: Adopt healthier eating patterns, Improve energy levels and physical activity, and Improve quality of life   Weight history: Gradual, last 10 years, 3-5 years   When asked how has your weight affected you? She states: Having fatigue and Having poor endurance   Some associated conditions: Hyperlipidemia   Contributing factors: Family history of obesity, Use of obesogenic medications: Contraceptives or hormonal therapy, Moderate to high levels of stress, and Menopause   Weight promoting medications identified: Contraceptives or hormonal therapy   Current nutrition plan: None   Current level of physical activity: Walking 30-60 minutes and NEAT   Current or previous pharmacotherapy: None   Response to medication: Never tried medications    Weight history:   Life events associated with weight gain include :  Menopause .   Their highest weight has been:  169 lbs.  Desired weight: 130  Previous weight-loss programs : None.  Their maximum weight loss was:  0 lbs.  Their greatest challenge with dieting: never tried dieting  before.  Current or previous pharmacotherapy: None.  Response to medication: Never tried medications   Nutritional History:  Current nutrition plan: None.  How many times do you eat outside the home: 5-7 per week  How often do they skip meals: does not skip meals  What beverages do they drink: water, caffeinated beverages , and alcohol, type: mixed, 1-2 drinks per week.   Use of artificial sweetners : No  Food intolerances or dislikes: none.  Food triggers: Stress, Boredom, Seeking reward, To help comfort self, and When Sad.  Food cravings: Sugary  Do they struggle with excessive hunger or portion control : No    Physical Activity:  Current level of physical activity: Walking 60 minutes, three a week  Barriers to Exercise: orthopedic problems and chronic pain   Past medical history includes:   Past Medical History:  Diagnosis Date   Abnormal Pap smear of cervix    age 15   Back pain    Fatty liver    GERD (gastroesophageal reflux disease)    Graves disease    in remission   High cholesterol    HSV-1 infection    oral cold sores   Migraines      Objective   BP 116/78   Pulse 71   Temp 97.9 F (36.6 C)   Ht 5\' 2"  (1.575 m)   Wt 166 lb (75.3 kg)   LMP 04/14/2017   SpO2 98%   BMI 30.36 kg/m  She was weighed on the bioimpedance scale: Body mass index is 30.36 kg/m.    Anthropometrics:  Vitals Temp: 97.9 F (36.6 C) BP: 116/78  Pulse Rate: 71 SpO2: 98 %   Anthropometric Measurements Height: 5\' 2"  (1.575 m) Weight: 166 lb (75.3 kg) BMI (Calculated): 30.35 Starting Weight: 166 lb Peak Weight: 172 lb Waist Measurement : 38 inches   Body Composition  Body Fat %: 36.1 % Fat Mass (lbs): 60.2 lbs Muscle Mass (lbs): 101 lbs Total Body Water (lbs): 68.4 lbs Visceral Fat Rating : 9   Other Clinical Data RMR: 1354 Fasting: yes Labs: yes Today's Visit #: 1 Starting Date: 05/04/23    Physical Exam:  General: She is overweight,  cooperative, alert, well developed, and in no acute distress. PSYCH: Has normal mood, affect and thought process.   HEENT: EOMI, sclerae are anicteric. Lungs: Normal breathing effort, no conversational dyspnea. Extremities: No edema.  Neurologic: No gross sensory or motor deficits. No tremors or fasciculations noted.    Diagnostic Data Reviewed  EKG: Normal sinus rhythm, rate 70. No conduction abnormalities, abnormal Q waves or chamber enlargement.  Indirect Calorimeter completed today shows a VO2 of 197 and a REE of 1354.  Her calculated basal metabolic rate is 2440 thus her resting energy expenditure slower than calculated.  Depression Screen  Andrea Walsh's PHQ-9 score was: 16.      No data to display          Screening for Sleep Related Breathing Disorders  Andrea Walsh denies daytime somnolence and denies waking up still tired. Patient denies a history of OSA symptoms. Andrea Walsh generally gets 8 hours of sleep per night, and states that she has generally restful sleep. Snoring is sometimes present. Apneic episodes are not present. Epworth Sleepiness Score is 1.   BMET    Component Value Date/Time   NA 141 12/13/2022 0000   K 5.0 12/13/2022 0000   CL 105 12/13/2022 0000   CO2 32 (A) 12/13/2022 0000   BUN 13 12/13/2022 0000   CREATININE 0.8 12/13/2022 0000   CALCIUM 9.8 12/13/2022 0000   No results found for: "HGBA1C" No results found for: "INSULIN" CBC    Component Value Date/Time   WBC 6.2 12/13/2022 0000   RBC 4.2 12/13/2022 0000   HGB 14.1 12/13/2022 0000   HCT 41 12/13/2022 0000   PLT 275 12/13/2022 0000   Iron/TIBC/Ferritin/ %Sat No results found for: "IRON", "TIBC", "FERRITIN", "IRONPCTSAT" Lipid Panel     Component Value Date/Time   CHOL 234 (A) 12/13/2022 0000   TRIG 130 12/13/2022 0000   HDL 50 12/13/2022 0000   LDLCALC 161 12/13/2022 0000   Hepatic Function Panel     Component Value Date/Time   ALBUMIN 4.4 12/13/2022 0000   AST 21 12/13/2022 0000    ALT 38 (A) 12/13/2022 0000   ALKPHOS 74 12/13/2022 0000   No results found for: "TSH"   Assessment and Plan   TREATMENT PLAN FOR OBESITY:  Recommended Dietary Goals  Andrea Walsh is currently in the action stage of change. As such, her goal is to implement medically supervised weight loss plan.  She has agreed to implement: the Category 2 plan - 1200 kcal per day  Behavioral Intervention  We discussed the following Behavioral Modification Strategies today: increasing lean protein intake to established goals, decreasing simple carbohydrates , increasing vegetables, increasing lower glycemic fruits, increasing fiber rich foods, avoiding skipping meals, increasing water intake, work on meal planning and preparation, reading food labels , keeping healthy foods at home, identifying sources and decreasing liquid calories, decreasing eating out or consumption of processed foods, and making healthy choices when eating convenient foods, planning for  success, and better snacking choices  Additional resources provided today: Handout on healthy eating and balanced plate, Handout on complex carbohydrates and lean sources of protein, and Category 2 packet  Recommended Physical Activity Goals  Andrea Walsh has been advised to work up to 150 minutes of moderate intensity aerobic activity a week and strengthening exercises 2-3 times per week for cardiovascular health, weight loss maintenance and preservation of muscle mass.   She has agreed to :  Think about enjoyable ways to increase daily physical activity and overcoming barriers to exercise and Increase physical activity in their day and reduce sedentary time (increase NEAT).  Pharmacotherapy We will work on building a Therapist, art and behavioral strategies. We will discuss the role of pharmacotherapy as an adjunct at subsequent visits.   ASSOCIATED CONDITIONS ADDRESSED TODAY  Other Fatigue Janiqua denies daytime somnolence  and denies waking up still tired. Patient denies a history of OSA symptoms. Andrea Walsh generally gets 8 hours of sleep per night, and states that she has generally restful sleep. Snoring is sometimes present. Apneic episodes are not present. Epworth Sleepiness Score is 1. Andrea Walsh does feel that her weight is causing her energy to be lower than it should be. Fatigue may be related to obesity, depression or many other causes. Labs will be ordered, and in the meanwhile, Andrea Walsh will focus on self care including making healthy food choices, increasing physical activity and focusing on stress reduction.  Shortness of Breath Andrea Walsh notes increasing shortness of breath with exercising and seems to be worsening over time with weight gain. She notes getting out of breath sooner with activity than she used to. This has not gotten worse recently. Andrea Walsh denies shortness of breath at rest or orthopnea.Andrea Walsh notes increasing shortness of breath with exercising and seems to be worsening over time with weight gain. She notes getting out of breath sooner with activity than she used to. This has not gotten worse recently. Andrea Walsh denies shortness of breath at rest or orthopnea.  Other fatigue -     EKG 12-Lead  SOB (shortness of breath) on exertion  Depression screen  Dyslipidemia Assessment & Plan: I reviewed most recent labs from [facility] group from November 2024 she has a total cholesterol of 234 and HDL 50 triglycerides of 130 and LDL cholesterol of 161.  She underwent coronary artery calcium scoring and had a 0 she therefore opted to proceed with therapeutic lifestyle changes.  For the most part she avoids saturated fats in her diet.  Losing 10% of body weight may improve LDL values.  This may also be genetic.  Patient will also be evaluated for glycemic disorders including insulin resistance today   Class 1 obesity with serious comorbidity and body mass index (BMI) of 30.0 to 30.9 in adult,  unspecified obesity type Assessment & Plan: See obesity treatment plan   Metabolic dysfunction-associated steatotic liver disease (MASLD) Assessment & Plan: Detected on CT scan of the chest which showed hepatic steatosis.  We reviewed this he states and pathogenesis with the patient.  Treatment includes reducing alcohol consumption, saturated fats and simple and added sugars in diet.  Losing 15% of body weight may improve condition.  Most recent liver enzymes were normal she also has a normal platelet count so fibrosis score is likely low.  Orders: -     Hemoglobin A1c -     Insulin, random -     VITAMIN D 25 Hydroxy (Vit-D Deficiency, Fractures)  Graves' disease Assessment & Plan: Sees  Dr. Talmage Nap, reports stable TSH. Did not require treatment. Monitored by ENDO.      Follow-up  She was informed of the importance of frequent follow-up visits to maximize her success with intensive lifestyle modifications for her multiple health conditions. She was informed we would discuss her lab results at her next visit unless there is a critical issue that needs to be addressed sooner. Andrea Walsh agreed to keep her next visit at the agreed upon time to discuss these results.  Attestation Statement  This is the patient's intake visit at [facility]. The patient's Health Questionnaire was reviewed at length. Included in the packet: current and past health history, medications, allergies, ROS, gynecologic history (women only), surgical history, family history, social history, weight history, weight loss surgery history (for those that have had weight loss surgery), nutritional evaluation, mood and food questionnaire, PHQ9, Epworth questionnaire, sleep habits questionnaire, patient life and health improvement goals questionnaire. These will all be scanned into the patient's chart under media.   During the visit, I independently reviewed the patient's EKG, previous labs, bioimpedance scale  results, and indirect calorimetry results. I used this information to medically tailor a meal plan for the patient that will help her to lose weight and will improve her obesity-related conditions. I performed a medically necessary appropriate examination and/or evaluation. I discussed the assessment and treatment plan with the patient. The patient was provided an opportunity to ask questions and all were answered. The patient agreed with the plan and demonstrated an understanding of the instructions. Labs were ordered at this visit and will be reviewed at the next visit unless critical results need to be addressed immediately. Clinical information was updated and documented in the EMR.   In addition, they received basic education on identification of processed foods and reduction of these, different sources of lean proteins and complex carbohydrates and how to eat balanced by incorporation of whole foods.  Reviewed by clinician on day of visit: allergies, medications, problem list, medical history, surgical history, family history, social history, and previous encounter notes.  I have spent 60 minutes in the care of the patient today including: 5 minutes before the visit reviewing the chart 40 minutes face-to-face assessing and reviewing listed medical problems as outlined in obesity care plan, providing nutritional and behavioral counseling as outlined in obesity care plan, independently interpreting results and goals of care, see listed medical problems, discussing biometric information and progress, and reviewing elevated cholesterol, cardiovascular risk in the presence of MAS ILD.  Also received nutrition counseling and guidance in regards to implementation of reduced calorie nutrition plan 10 minutes after the visit on documentation    Worthy Rancher, MD

## 2023-05-04 NOTE — Assessment & Plan Note (Addendum)
 Sees Dr. Talmage Nap, reports stable TSH. Did not require treatment. Monitored by ENDO.

## 2023-05-04 NOTE — Assessment & Plan Note (Signed)
 I reviewed most recent labs from [facility] group from November 2024 she has a total cholesterol of 234 and HDL 50 triglycerides of 130 and LDL cholesterol of 161.  She underwent coronary artery calcium scoring and had a 0 she therefore opted to proceed with therapeutic lifestyle changes.  For the most part she avoids saturated fats in her diet.  Losing 10% of body weight may improve LDL values.  This may also be genetic.  Patient will also be evaluated for glycemic disorders including insulin resistance today

## 2023-05-04 NOTE — Assessment & Plan Note (Signed)
 Detected on CT scan of the chest which showed hepatic steatosis.  We reviewed this he states and pathogenesis with the patient.  Treatment includes reducing alcohol consumption, saturated fats and simple and added sugars in diet.  Losing 15% of body weight may improve condition.  Most recent liver enzymes were normal she also has a normal platelet count so fibrosis score is likely low.

## 2023-05-04 NOTE — Assessment & Plan Note (Signed)
 See obesity treatment plan

## 2023-05-05 LAB — HEMOGLOBIN A1C
Est. average glucose Bld gHb Est-mCnc: 123 mg/dL
Hgb A1c MFr Bld: 5.9 % — ABNORMAL HIGH (ref 4.8–5.6)

## 2023-05-05 LAB — VITAMIN D 25 HYDROXY (VIT D DEFICIENCY, FRACTURES): Vit D, 25-Hydroxy: 31.3 ng/mL (ref 30.0–100.0)

## 2023-05-05 LAB — INSULIN, RANDOM: INSULIN: 15.5 u[IU]/mL (ref 2.6–24.9)

## 2023-05-10 ENCOUNTER — Other Ambulatory Visit: Payer: Self-pay

## 2023-05-18 ENCOUNTER — Ambulatory Visit (INDEPENDENT_AMBULATORY_CARE_PROVIDER_SITE_OTHER): Admitting: Internal Medicine

## 2023-05-18 ENCOUNTER — Encounter (INDEPENDENT_AMBULATORY_CARE_PROVIDER_SITE_OTHER): Payer: Self-pay | Admitting: Internal Medicine

## 2023-05-18 VITALS — BP 128/75 | HR 76 | Temp 98.0°F | Ht 62.0 in | Wt 165.0 lb

## 2023-05-18 DIAGNOSIS — E785 Hyperlipidemia, unspecified: Secondary | ICD-10-CM | POA: Diagnosis not present

## 2023-05-18 DIAGNOSIS — R7303 Prediabetes: Secondary | ICD-10-CM | POA: Insufficient documentation

## 2023-05-18 DIAGNOSIS — Z6830 Body mass index (BMI) 30.0-30.9, adult: Secondary | ICD-10-CM

## 2023-05-18 DIAGNOSIS — E66811 Obesity, class 1: Secondary | ICD-10-CM | POA: Diagnosis not present

## 2023-05-18 DIAGNOSIS — K760 Fatty (change of) liver, not elsewhere classified: Secondary | ICD-10-CM | POA: Diagnosis not present

## 2023-05-18 NOTE — Assessment & Plan Note (Signed)
 Detected on CT scan of the chest which showed hepatic steatosis.  Liver enzymes from November showed a mild elevation in ALT.  Losing 15% of body weight may improve condition.  Most recent liver enzymes were normal she also has a normal platelet count so fibrosis score is likely low.  Continue with current weight management strategy

## 2023-05-18 NOTE — Assessment & Plan Note (Signed)
 Patient has lost 1 pound she has been tracking and journaling and has noticed that has been staying above calorie target on some days.  Her indirect calorimetry suggest a slower metabolic rate than calculated of 1354 she is aware that to lose about a pound a week we need to generate a 500-calorie deficit via nutrition and exercise.  She will continue to work on nutrition and increased volume of physical activity.  We also discussed the role of antiobesity medications today.

## 2023-05-18 NOTE — Assessment & Plan Note (Signed)
 Prediabetes with insulin  resistance, indicated by A1c of 5.9% and insulin  levels of 15.5. Emphasized the importance of weight loss and exercise in reducing the risk of progression to diabetes. Discussed metformin's role in reducing insulin  levels, improving gut microbiome, and aiding in weight management. Metformin reduces diabetes progression risk by 30%, while weight loss of 7-10% and 150 minutes of exercise per week can reduce the risk by 58%. - Consider metformin for insulin  resistance and weight management. - Encourage weight loss of 7-10% to reduce diabetes risk. - Recommend 150 minutes of exercise per week - Hold off starting metformin. -Continue to maintain a diet low on starchy foods and simple carbs.

## 2023-05-18 NOTE — Assessment & Plan Note (Signed)
 I reviewed most recent labs from [facility] physicians group from November 2024 she has a total cholesterol of 234 and HDL 50 triglycerides of 130 and LDL cholesterol of 161.  She underwent coronary artery calcium scoring and had a 0 she therefore opted to proceed with therapeutic lifestyle changes.  For the most part she avoids saturated fats in her diet.  Losing 10% of body weight may improve LDL values.  This may also be genetic.  Continue with current weight management strategy

## 2023-05-18 NOTE — Progress Notes (Signed)
 The 10-year ASCVD risk score (Arnett DK, et al., 2019) is: 2.6%* (Cholesterol units were assumed)   Office: [phone]  /  Fax: [phone]  Weight Summary And Biometrics  Vitals Temp: 98 F (36.7 C) BP: 128/75 Pulse Rate: 76 SpO2: 97 %   Anthropometric Measurements Height: 5\' 2"  (1.575 m) Weight: 165 lb (74.8 kg) BMI (Calculated): 30.17 Weight at Last Visit: 166 lb Weight Lost Since Last Visit: 1 lb Weight Gained Since Last Visit: 0 Starting Weight: 166 lb Total Weight Loss (lbs): 1 lb (0.454 kg) Peak Weight: 172 lb   Body Composition  Body Fat %: 35.8 % Fat Mass (lbs): 59.2 lbs Muscle Mass (lbs): 100.6 lbs Total Body Water (lbs): 67.8 lbs Visceral Fat Rating : 9    RMR: 1354  Today's Visit #: 2  Starting Date: 05/04/23   Subjective   Chief Complaint: Obesity  Interval History Discussed the use of AI scribe software for clinical note transcription with the patient, who gave verbal consent to proceed.  History of Present Illness   Andrea Walsh is a 58 year old female who presents for medical weight management.  Since her last visit, she has lost one pound. She adheres to a calorie target of 1200 to 1400 calories per day but struggles to meet protein goals. She typically consumes about 60 grams of protein daily, primarily through three meals, and supplements with protein shakes and bars when necessary.  She has been experiencing an upper respiratory illness for at least a week, which has impacted her adherence to her dietary plan. Despite this, she has not been feeling hungry, attributing this to increased protein intake, which helps her feel satiated and reduces snacking. She uses protein shakes and bars, such as [employer], to help meet her protein needs.  Calorie tracking apps are cumbersome for her, and she prefers not to spend much time logging food intake. She is open to alternative tracking methods, such as focusing on macronutrients like  protein or fiber, and is considering using apps with AI meal scanning capabilities for convenience.  She typically does not eat a meal after work due to her late return home, opting instead for a protein bar or shake around 4 or 5 PM. Her largest meal is breakfast, and she tries to incorporate healthy snacks like apples, peanut butter, and hummus throughout the day.  In terms of physical activity, she enjoys hiking, walking, yoga, and has some experience with body weight resistance exercises using dumbbells. She has not been consistent with these activities recently due to her illness.  Her family history includes breast cancer in her grandmother and aunt, as well as a history of skin cancer. She follows a vegetarian diet and does not smoke.       Challenges affecting patient progress: none.    Pharmacotherapy for weight management: She is currently taking no anti-obesity medication.   Assessment and Plan   Treatment Plan For Obesity:  Recommended Dietary Goals  Andrea Walsh is currently in the action stage of change. As such, her goal is to continue weight management plan. She has agreed to: keep a food journal with a target of  1200 calories per day and 90-120 grams of protein per day or 30-40 grams per meal. and follow the Category 2 plan - 1200 kcal per day  Behavioral Health and Counseling  We discussed the following behavioral modification strategies today: work on tracking and journaling calories using tracking application and practice mindfulness eating and portion control as  an alternative to tracking .  Additional education and resources provided today: None  Recommended Physical Activity Goals  Andrea Walsh has been advised to work up to 150 minutes of moderate intensity aerobic activity a week and strengthening exercises 2-3 times per week for cardiovascular health, weight loss maintenance and preservation of muscle mass.   She has agreed to :  continue to gradually increase  the amount and intensity of exercise routine  Pharmacotherapy  We discussed various medication options to help Andrea Walsh with her weight loss efforts and we both agreed to : continue with nutritional and behavioral strategies and reviewed benefits and side effects of metformin and the role of antiobesity medications.  Associated Conditions Impacted by Obesity Treatment  Metabolic dysfunction-associated steatotic liver disease (MASLD) Assessment & Plan: Detected on CT scan of the chest which showed hepatic steatosis.  Liver enzymes from November showed a mild elevation in ALT.  Losing 15% of body weight may improve condition.  Most recent liver enzymes were normal she also has a normal platelet count so fibrosis score is likely low.  Continue with current weight management strategy   Class 1 obesity with serious comorbidity and body mass index (BMI) of 30.0 to 30.9 in adult, unspecified obesity type Assessment & Plan: Patient has lost 1 pound she has been tracking and journaling and has noticed that has been staying above calorie target on some days.  Her indirect calorimetry suggest a slower metabolic rate than calculated of 1354 she is aware that to lose about a pound a week we need to generate a 500-calorie deficit via nutrition and exercise.  She will continue to work on nutrition and increased volume of physical activity.  We also discussed the role of antiobesity medications today.   Dyslipidemia Assessment & Plan: I reviewed most recent labs from [facility] physicians group from November 2024 she has a total cholesterol of 234 and HDL 50 triglycerides of 130 and LDL cholesterol of 161.  She underwent coronary artery calcium scoring and had a 0 she therefore opted to proceed with therapeutic lifestyle changes.  For the most part she avoids saturated fats in her diet.  Losing 10% of body weight may improve LDL values.  This may also be genetic.  Continue with current weight management  strategy   Prediabetes Assessment & Plan: Prediabetes with insulin  resistance, indicated by A1c of 5.9% and insulin  levels of 15.5. Emphasized the importance of weight loss and exercise in reducing the risk of progression to diabetes. Discussed metformin's role in reducing insulin  levels, improving gut microbiome, and aiding in weight management. Metformin reduces diabetes progression risk by 30%, while weight loss of 7-10% and 150 minutes of exercise per week can reduce the risk by 58%. - Consider metformin for insulin  resistance and weight management. - Encourage weight loss of 7-10% to reduce diabetes risk. - Recommend 150 minutes of exercise per week - Hold off starting metformin. -Continue to maintain a diet low on starchy foods and simple carbs.      General prevention She is up-to-date on mammography, Pap does not qualify for lung cancer screening has been thinking about getting her screening colonoscopy.       Objective   Physical Exam:  Blood pressure 128/75, pulse 76, temperature 98 F (36.7 C), height 5\' 2"  (1.575 m), weight 165 lb (74.8 kg), last menstrual period 04/14/2017, SpO2 97%. Body mass index is 30.18 kg/m.  General: She is overweight, cooperative, alert, well developed, and in no acute distress. PSYCH: Has normal  mood, affect and thought process.   HEENT: EOMI, sclerae are anicteric. Lungs: Normal breathing effort, no conversational dyspnea. Extremities: No edema.  Neurologic: No gross sensory or motor deficits. No tremors or fasciculations noted.    Diagnostic Data Reviewed:  BMET    Component Value Date/Time   NA 141 12/13/2022 0000   K 5.0 12/13/2022 0000   CL 105 12/13/2022 0000   CO2 32 (A) 12/13/2022 0000   BUN 13 12/13/2022 0000   CREATININE 0.8 12/13/2022 0000   CALCIUM 9.8 12/13/2022 0000   Lab Results  Component Value Date   HGBA1C 5.9 (H) 05/04/2023   Lab Results  Component Value Date   INSULIN  15.5 05/04/2023   No results  found for: "TSH" CBC    Component Value Date/Time   WBC 6.2 12/13/2022 0000   RBC 4.2 12/13/2022 0000   HGB 14.1 12/13/2022 0000   HCT 41 12/13/2022 0000   PLT 275 12/13/2022 0000   Iron Studies No results found for: "IRON", "TIBC", "FERRITIN", "IRONPCTSAT" Lipid Panel     Component Value Date/Time   CHOL 234 (A) 12/13/2022 0000   TRIG 130 12/13/2022 0000   HDL 50 12/13/2022 0000   LDLCALC 161 12/13/2022 0000   Hepatic Function Panel     Component Value Date/Time   ALBUMIN 4.4 12/13/2022 0000   AST 21 12/13/2022 0000   ALT 38 (A) 12/13/2022 0000   ALKPHOS 74 12/13/2022 0000   No results found for: "TSH" Nutritional Lab Results  Component Value Date   VD25OH 31.3 05/04/2023    Medications: Outpatient Encounter Medications as of 05/18/2023  Medication Sig   acetic acid -hydrocortisone  (VOSOL -HC) OTIC solution Place 4 drops in the right ear nightly for 1 week then as needed for itching and fullness.   ALPRAZolam  (XANAX ) 0.25 MG tablet Take 1 tablet (0.25 mg total) by mouth daily as needed.   ALPRAZolam  (XANAX ) 0.25 MG tablet Take 1 tablet (0.25 mg total) by mouth daily as needed.   aspirin-acetaminophen -caffeine (EXCEDRIN MIGRAINE) 250-250-65 MG tablet Take 2 tablets by mouth every 6 (six) hours as needed for headache.   estradiol  (CLIMARA  - DOSED IN MG/24 HR) 0.05 mg/24hr patch Place 1 patch (0.05 mg total) onto the skin once a week.   fluticasone (FLONASE) 50 MCG/ACT nasal spray Place into both nostrils daily.   ibuprofen  (ADVIL ) 200 MG tablet Take 200 mg by mouth every 6 (six) hours as needed.   methocarbamol  (ROBAXIN ) 500 MG tablet Take 1 tablet (500 mg total) by mouth every 4 (four) hours as needed.   methocarbamol  (ROBAXIN ) 500 MG tablet Take 1 tablet (500 mg total) by mouth every 4 (four) hours as needed.   Multiple Vitamin (MULTIVITAMIN) tablet Take 1 tablet by mouth daily.   Omega-3 Fatty Acids (OMEGA 3 500 PO) Take by mouth.   progesterone  (PROMETRIUM ) 100 MG  capsule Take 1 capsule (100 mg total) by mouth at bedtime.   progesterone  (PROMETRIUM ) 100 MG capsule Take 1 capsule (100 mg total) by mouth daily.   Rimegepant Sulfate  (NURTEC) 75 MG TBDP Dissolve 1 tablet on the tongue for migraine as needed. Max 1 per day.   Rimegepant Sulfate  (NURTEC) 75 MG TBDP Dissolve 1 tablet (75 mg) on the tongue for migrines as needed. Max 1 per day.   valACYclovir  (VALTREX ) 1000 MG tablet Take one tablet by mouth daily x 5 days with recurrences.   No facility-administered encounter medications on file as of 05/18/2023.     Follow-Up   Return in  about 6 weeks (around 06/29/2023) for For Weight Mangement with Dr. Allie Area.Aaron Aas She was informed of the importance of frequent follow up visits to maximize her success with intensive lifestyle modifications for her multiple health conditions.  Attestation Statement   Reviewed by clinician on day of visit: allergies, medications, problem list, medical history, surgical history, family history, social history, and previous encounter notes.   I have spent 43 minutes in the care of the patient today including: 3 minutes before the visit reviewing and prepping the chart 33 minutes face-to-face assessing and reviewing listed medical problems as outlined in obesity care plan, providing nutritional and behavioral counseling as outlined in obesity care plan, counseling regarding anti-obesity medication as outlined in obesity care plan, independently interpreting results and goals of care, see listed medical problems, discussing biometric information and progress, and reviewing pertinent diagnostics which are listed under medical problems 7 minutes after the visit updating chart and documentation    Ladd Picker, MD

## 2023-05-30 ENCOUNTER — Other Ambulatory Visit (HOSPITAL_COMMUNITY): Payer: Self-pay

## 2023-06-15 ENCOUNTER — Other Ambulatory Visit (HOSPITAL_COMMUNITY): Payer: Self-pay

## 2023-07-04 ENCOUNTER — Other Ambulatory Visit (HOSPITAL_COMMUNITY): Payer: Self-pay

## 2023-07-04 DIAGNOSIS — F430 Acute stress reaction: Secondary | ICD-10-CM | POA: Diagnosis not present

## 2023-07-04 DIAGNOSIS — Z6831 Body mass index (BMI) 31.0-31.9, adult: Secondary | ICD-10-CM | POA: Diagnosis not present

## 2023-07-04 MED ORDER — ALPRAZOLAM 0.25 MG PO TABS
0.2500 mg | ORAL_TABLET | Freq: Every day | ORAL | 2 refills | Status: DC | PRN
  Filled 2023-08-08: qty 30, 30d supply, fill #0
  Filled 2023-09-11: qty 30, 30d supply, fill #1
  Filled 2023-11-06: qty 30, 30d supply, fill #2

## 2023-07-13 ENCOUNTER — Ambulatory Visit (INDEPENDENT_AMBULATORY_CARE_PROVIDER_SITE_OTHER): Admitting: Internal Medicine

## 2023-08-08 ENCOUNTER — Other Ambulatory Visit (HOSPITAL_COMMUNITY): Payer: Self-pay

## 2023-09-11 ENCOUNTER — Other Ambulatory Visit (HOSPITAL_COMMUNITY): Payer: Self-pay

## 2023-10-27 ENCOUNTER — Other Ambulatory Visit (HOSPITAL_COMMUNITY): Payer: Self-pay

## 2023-11-06 ENCOUNTER — Other Ambulatory Visit: Payer: Self-pay | Admitting: Obstetrics and Gynecology

## 2023-11-06 ENCOUNTER — Other Ambulatory Visit: Payer: Self-pay

## 2023-11-06 ENCOUNTER — Other Ambulatory Visit (HOSPITAL_COMMUNITY): Payer: Self-pay

## 2023-11-06 MED ORDER — ESTRADIOL 0.05 MG/24HR TD PTWK
0.0500 mg | MEDICATED_PATCH | TRANSDERMAL | 2 refills | Status: DC
  Filled 2023-11-06: qty 12, 84d supply, fill #0

## 2023-11-06 NOTE — Telephone Encounter (Signed)
.  Med refill request: [facility]   Last AEX: 12/05/22 Next AEX: Not scheduled, sent a message to the front to schedule an appt  Last MMG (if hormonal med) 09/09/22 Refill authorized: Please Advise?

## 2023-11-08 ENCOUNTER — Other Ambulatory Visit (HOSPITAL_COMMUNITY): Payer: Self-pay

## 2023-12-12 ENCOUNTER — Other Ambulatory Visit (HOSPITAL_COMMUNITY): Payer: Self-pay

## 2023-12-12 ENCOUNTER — Other Ambulatory Visit: Payer: Self-pay | Admitting: Obstetrics and Gynecology

## 2023-12-12 ENCOUNTER — Other Ambulatory Visit: Payer: Self-pay

## 2023-12-12 MED ORDER — PROGESTERONE MICRONIZED 100 MG PO CAPS
100.0000 mg | ORAL_CAPSULE | Freq: Every day | ORAL | 3 refills | Status: DC
  Filled 2023-12-12: qty 90, 90d supply, fill #0

## 2023-12-12 NOTE — Telephone Encounter (Signed)
 Med refill request: *progesterone  (PROMETRIUM ) 100 MG capsule  Start:  12/05/22 Disp:   90 tablets Refills:  0 of 3 remaining  Last AEX:  12/05/22 Next AEX:  01/02/24 Last MMG (if hormonal med):  09/09/22 Refill authorized? Please Advise.

## 2023-12-18 DIAGNOSIS — E0590 Thyrotoxicosis, unspecified without thyrotoxic crisis or storm: Secondary | ICD-10-CM | POA: Diagnosis not present

## 2023-12-18 DIAGNOSIS — E7800 Pure hypercholesterolemia, unspecified: Secondary | ICD-10-CM | POA: Diagnosis not present

## 2023-12-18 DIAGNOSIS — Z0000 Encounter for general adult medical examination without abnormal findings: Secondary | ICD-10-CM | POA: Diagnosis not present

## 2023-12-22 ENCOUNTER — Other Ambulatory Visit (HOSPITAL_COMMUNITY): Payer: Self-pay

## 2023-12-22 DIAGNOSIS — J329 Chronic sinusitis, unspecified: Secondary | ICD-10-CM | POA: Diagnosis not present

## 2023-12-22 DIAGNOSIS — J40 Bronchitis, not specified as acute or chronic: Secondary | ICD-10-CM | POA: Diagnosis not present

## 2023-12-22 DIAGNOSIS — H1033 Unspecified acute conjunctivitis, bilateral: Secondary | ICD-10-CM | POA: Diagnosis not present

## 2023-12-22 MED ORDER — AMOXICILLIN-POT CLAVULANATE 875-125 MG PO TABS
1.0000 | ORAL_TABLET | Freq: Two times a day (BID) | ORAL | 0 refills | Status: AC
  Filled 2023-12-22: qty 20, 10d supply, fill #0

## 2023-12-22 MED ORDER — TOBRAMYCIN 0.3 % OP SOLN
1.0000 [drp] | OPHTHALMIC | 0 refills | Status: AC
  Filled 2023-12-22: qty 5, 7d supply, fill #0

## 2023-12-28 DIAGNOSIS — F430 Acute stress reaction: Secondary | ICD-10-CM | POA: Diagnosis not present

## 2023-12-28 DIAGNOSIS — G43009 Migraine without aura, not intractable, without status migrainosus: Secondary | ICD-10-CM | POA: Diagnosis not present

## 2023-12-28 DIAGNOSIS — Z0000 Encounter for general adult medical examination without abnormal findings: Secondary | ICD-10-CM | POA: Diagnosis not present

## 2023-12-28 DIAGNOSIS — Z6830 Body mass index (BMI) 30.0-30.9, adult: Secondary | ICD-10-CM | POA: Diagnosis not present

## 2023-12-28 DIAGNOSIS — M549 Dorsalgia, unspecified: Secondary | ICD-10-CM | POA: Diagnosis not present

## 2023-12-28 DIAGNOSIS — E7800 Pure hypercholesterolemia, unspecified: Secondary | ICD-10-CM | POA: Diagnosis not present

## 2024-01-02 ENCOUNTER — Encounter: Payer: Self-pay | Admitting: Obstetrics and Gynecology

## 2024-01-02 ENCOUNTER — Ambulatory Visit (INDEPENDENT_AMBULATORY_CARE_PROVIDER_SITE_OTHER): Admitting: Obstetrics and Gynecology

## 2024-01-02 ENCOUNTER — Other Ambulatory Visit (HOSPITAL_COMMUNITY)
Admission: RE | Admit: 2024-01-02 | Discharge: 2024-01-02 | Disposition: A | Discharge status: Home / Self Care | Source: Ambulatory Visit | Attending: Obstetrics and Gynecology | Admitting: Obstetrics and Gynecology

## 2024-01-02 ENCOUNTER — Other Ambulatory Visit (HOSPITAL_COMMUNITY): Payer: Self-pay

## 2024-01-02 VITALS — BP 116/80 | HR 88 | Ht 61.25 in | Wt 168.0 lb

## 2024-01-02 DIAGNOSIS — Z01419 Encounter for gynecological examination (general) (routine) without abnormal findings: Secondary | ICD-10-CM

## 2024-01-02 DIAGNOSIS — E2839 Other primary ovarian failure: Secondary | ICD-10-CM | POA: Diagnosis not present

## 2024-01-02 DIAGNOSIS — Z1231 Encounter for screening mammogram for malignant neoplasm of breast: Secondary | ICD-10-CM | POA: Diagnosis not present

## 2024-01-02 DIAGNOSIS — Z1211 Encounter for screening for malignant neoplasm of colon: Secondary | ICD-10-CM

## 2024-01-02 MED ORDER — ESTRADIOL 0.05 MG/24HR TD PTWK
0.0500 mg | MEDICATED_PATCH | TRANSDERMAL | 2 refills | Status: AC
  Filled 2024-01-02 – 2024-02-09 (×2): qty 12, 84d supply, fill #0

## 2024-01-02 MED ORDER — PROGESTERONE MICRONIZED 100 MG PO CAPS
100.0000 mg | ORAL_CAPSULE | Freq: Every day | ORAL | 6 refills | Status: AC
  Filled 2024-01-02: qty 90, 90d supply, fill #0

## 2024-01-02 NOTE — Patient Instructions (Signed)
 Locations you can schedule your bone scan are:  The [facility] bone scan location scheduling line is [phone]  [facility] is [phone]  [facility] radiology [phone]   [facility] [phone]  [facility]: [phone]    Another option if they are behind is [facility] and their number is [phone].  I can send the referral if you want to go there.    Please let me know if you have any trouble scheduling. This is important for management of osteoporosis or osteopenia.   I can sit down with you after the scan to discuss results and treatment.  Dr. Glennon

## 2024-01-02 NOTE — Progress Notes (Signed)
 58 y.o. y.o. female here for annual exam. Patient's last menstrual period was 04/14/2017.   [id number] Married.  S/P TL.  Nurse at [employer].  Twin daughters 30 yo.  All children 43 yo and above, doing well.  3 grand-children, 1 on the way.   Patient's last menstrual period was 04/14/2017.   HPI: Symptomatic menopause well on hormone replacement therapy since May 2019.  Taking estradiol  0.05 patch and Prometrium  100 mg at bedtime.  No postmenopausal bleeding.  No pelvic pain.  No pain with intercourse.  Pap Neg 02/2020.  No h/o abnormal Pap. Will repeat at 3 yrs.  Breasts normal s/p bilateral implants.   Mammo Neg 09/09/22. Urine and bowel movements normal.  Body mass index is 31.71 kg/m last visit.   Health labs with family physician.  No [facility] yet, recommend scheduling this year. Dxa: 01/02/24 referral placed, to do cologuard. Referral placed. CT calcium score 2024  Body mass index is 31.48 kg/m.     01/02/2024   10:24 AM  Depression screen PHQ 2/9  Decreased Interest 0  Down, Depressed, Hopeless 0  PHQ - 2 Score 0    Blood pressure 116/80, pulse 88, height 5' 1.25 (1.556 m), weight 168 lb (76.2 kg), last menstrual period 04/14/2017, SpO2 97%.  No results found for: DIAGPAP, HPVHIGH, ADEQPAP  GYN HISTORY: No results found for: DIAGPAP, HPVHIGH, ADEQPAP  OB History  Gravida Para Term Preterm AB Living  4 4   0 5  SAB IAB Ectopic Multiple Live Births  0  0 1     # Outcome Date GA Lbr Len/2nd Weight Sex Type Anes PTL Lv  4 Para           3 Para           2 Para           1 Para             Past Medical History:  Diagnosis Date   Abnormal Pap smear of cervix    age 54   Back pain    Fatty liver    GERD (gastroesophageal reflux disease)    Graves disease    in remission   High cholesterol    HSV-1 infection    oral cold sores   Migraines     Past Surgical History:  Procedure Laterality Date   AUGMENTATION MAMMAPLASTY Bilateral    32 years ago    CESAREAN SECTION     EYE SURGERY     TUBAL LIGATION      Current Outpatient Medications on File Prior to Visit  Medication Sig Dispense Refill   ALPRAZolam  (XANAX ) 0.25 MG tablet Take 1 tablet (0.25 mg total) by mouth daily as needed. 30 tablet 2   ALPRAZolam  (XANAX ) 0.25 MG tablet Take 1 tablet (0.25 mg total) by mouth daily as needed. 30 tablet 2   aspirin-acetaminophen -caffeine (EXCEDRIN MIGRAINE) 250-250-65 MG tablet Take 2 tablets by mouth every 6 (six) hours as needed for headache.     estradiol  (CLIMARA  - DOSED IN MG/24 HR) 0.05 mg/24hr patch Place 1 patch (0.05 mg total) onto the skin once a week. 12 patch 2   fluticasone (FLONASE) 50 MCG/ACT nasal spray Place into both nostrils daily.     ibuprofen  (ADVIL ) 200 MG tablet Take 200 mg by mouth every 6 (six) hours as needed.     methocarbamol  (ROBAXIN ) 500 MG tablet Take 1 tablet (500 mg total) by mouth every 4 (four) hours as  needed. 60 tablet 2   methocarbamol  (ROBAXIN ) 500 MG tablet Take 1 tablet (500 mg total) by mouth every 4 (four) hours as needed. 60 tablet 2   Multiple Vitamin (MULTIVITAMIN) tablet Take 1 tablet by mouth daily.     Omega-3 Fatty Acids (OMEGA 3 500 PO) Take by mouth.     progesterone  (PROMETRIUM ) 100 MG capsule Take 1 capsule (100 mg total) by mouth at bedtime. 90 capsule 0   progesterone  (PROMETRIUM ) 100 MG capsule Take 1 capsule (100 mg total) by mouth daily. 90 capsule 3   Rimegepant Sulfate  (NURTEC) 75 MG TBDP Dissolve 1 tablet on the tongue for migraine as needed. Max 1 per day. 8 tablet 12   Rimegepant Sulfate  (NURTEC) 75 MG TBDP Dissolve 1 tablet (75 mg) on the tongue for migraines as needed. Max 1 per day. 8 tablet 12   valACYclovir  (VALTREX ) 1000 MG tablet Take one tablet by mouth daily x 5 days with recurrences. 30 tablet 3   acetic acid -hydrocortisone  (VOSOL -HC) OTIC solution Place 4 drops in the right ear nightly for 1 week then as needed for itching and fullness. (Patient not taking: Reported on  01/02/2024) 10 mL 2   amoxicillin -clavulanate (AUGMENTIN ) 875-125 MG tablet Take 1 tablet by mouth every 12 (twelve) hours for 10 days. (Patient not taking: Reported on 01/02/2024) 20 tablet 0   tobramycin  (TOBREX ) 0.3 % ophthalmic solution Place 1 drop into the affected eye every 4 (four) hours for 7 days. (Patient not taking: Reported on 01/02/2024) 5 mL 0   No current facility-administered medications on file prior to visit.    Social History   Socioeconomic History   Marital status: Married    Spouse name: Not on file   Number of children: Not on file   Years of education: Not on file   Highest education level: Not on file  Occupational History   Not on file  Tobacco Use   Smoking status: Never    Passive exposure: Past   Smokeless tobacco: Never  Vaping Use   Vaping status: Never Used  Substance and Sexual Activity   Alcohol use: Yes    Comment: occ   Drug use: No   Sexual activity: Not Currently    Partners: Male    Birth control/protection: Surgical    Comment: 1st intercourse- 19, partners- 2 , btl  Other Topics Concern   Not on file  Social History Narrative   Not on file   Social Drivers of Health   Financial Resource Strain: Not on File (04/23/2021)   Received from [employer]    Financial Resource Strain: 0  Food Insecurity: Not on File (04/28/2021)   Received from [facility] Insecurity    Food: 0  Transportation Needs: Not on File (04/28/2021)   Received from [employer] Needs    Transportation: 0  Physical Activity: Not on File (04/23/2021)   Received from [facility]   Physical Activity    Physical Activity: 0  Stress: Not on File (04/23/2021)   Received from [facility]   Stress    Stress: 0  Social Connections: Not on File (10/03/2022)   Received from [employer]   Social Connections    Connectedness: 0  Intimate Partner Violence: Not on file    Family History  Problem Relation Age of Onset   High blood pressure Mother    High  Cholesterol Mother    Cancer Mother    Depression Mother  Liver disease Mother    Obesity Mother    Cancer Father        lung     Allergies  Allergen Reactions   Morphine And Codeine Nausea And Vomiting   Pyridium [Phenazopyridine Hcl] Rash      Patient's last menstrual period was Patient's last menstrual period was 04/14/2017.Andrea Walsh            Review of Systems Alls systems reviewed and are negative.     Physical Exam Constitutional:      Appearance: Normal appearance.  Genitourinary:     Vulva and urethral meatus normal.     No lesions in the vagina.     Right Labia: No rash, lesions or skin changes.    Left Labia: No lesions, skin changes or rash.    No vaginal discharge or tenderness.     No vaginal prolapse present.    No vaginal atrophy present.     Right Adnexa: not tender, not palpable and no mass present.    Left Adnexa: not tender, not palpable and no mass present.    No cervical motion tenderness or discharge.     Uterus is not enlarged, tender or irregular.  Breasts:    Right: Normal.     Left: Normal.  HENT:     Head: Normocephalic.  Neck:     Thyroid : No thyroid  mass, thyromegaly or thyroid  tenderness.  Cardiovascular:     Rate and Rhythm: Normal rate and regular rhythm.     Heart sounds: Normal heart sounds, S1 normal and S2 normal.  Pulmonary:     Effort: Pulmonary effort is normal.     Breath sounds: Normal breath sounds and air entry.  Abdominal:     General: There is no distension.     Palpations: Abdomen is soft. There is no mass.     Tenderness: There is no abdominal tenderness. There is no guarding or rebound.  Musculoskeletal:        General: Normal range of motion.     Cervical back: Full passive range of motion without pain, normal range of motion and neck supple. No tenderness.     Right lower leg: No edema.     Left lower leg: No edema.  Neurological:     Mental Status: She is alert.  Skin:    General: Skin is warm.   Psychiatric:        Mood and Affect: Mood normal.        Behavior: Behavior normal.        Thought Content: Thought content normal.  Vitals and nursing note reviewed. Exam conducted with a chaperone present.       A:         Well Woman GYN exam                             P:        Pap smear collected today Encouraged annual mammogram screening Colon cancer screening referral placed today DXA ordered today Labs and immunizations to do with PMD Refill on MHT sent. Encouraged healthy lifestyle practices Encouraged Vit D and Calcium   No follow-ups on file.  Andrea Walsh

## 2024-01-04 ENCOUNTER — Ambulatory Visit: Payer: Self-pay | Admitting: Obstetrics and Gynecology

## 2024-01-04 LAB — CYTOLOGY - PAP
Adequacy: ABSENT
Diagnosis: NEGATIVE

## 2024-01-05 ENCOUNTER — Other Ambulatory Visit (HOSPITAL_COMMUNITY): Payer: Self-pay

## 2024-01-05 DIAGNOSIS — J329 Chronic sinusitis, unspecified: Secondary | ICD-10-CM | POA: Diagnosis not present

## 2024-01-05 DIAGNOSIS — R051 Acute cough: Secondary | ICD-10-CM | POA: Diagnosis not present

## 2024-01-05 DIAGNOSIS — R0982 Postnasal drip: Secondary | ICD-10-CM | POA: Diagnosis not present

## 2024-01-05 MED ORDER — IPRATROPIUM BROMIDE 0.06 % NA SOLN
2.0000 | Freq: Three times a day (TID) | NASAL | 0 refills | Status: AC
  Filled 2024-01-05: qty 15, 13d supply, fill #0

## 2024-01-05 MED ORDER — DOXYCYCLINE HYCLATE 100 MG PO TABS
100.0000 mg | ORAL_TABLET | Freq: Two times a day (BID) | ORAL | 0 refills | Status: AC
  Filled 2024-01-05: qty 20, 10d supply, fill #0

## 2024-01-05 MED ORDER — PREDNISONE 50 MG PO TABS
50.0000 mg | ORAL_TABLET | Freq: Every day | ORAL | 0 refills | Status: AC
  Filled 2024-01-05: qty 5, 5d supply, fill #0

## 2024-01-08 ENCOUNTER — Other Ambulatory Visit: Payer: Self-pay

## 2024-01-08 ENCOUNTER — Other Ambulatory Visit (HOSPITAL_COMMUNITY): Payer: Self-pay

## 2024-01-08 MED ORDER — ALPRAZOLAM 0.25 MG PO TABS
0.2500 mg | ORAL_TABLET | Freq: Every day | ORAL | 5 refills | Status: AC | PRN
  Filled 2024-01-08: qty 30, 30d supply, fill #0
  Filled 2024-02-14: qty 30, 30d supply, fill #1

## 2024-01-08 MED ORDER — NURTEC 75 MG PO TBDP
75.0000 mg | ORAL_TABLET | Freq: Every day | ORAL | 1 refills | Status: AC | PRN
  Filled 2024-01-08: qty 8, 30d supply, fill #0
  Filled 2024-02-14: qty 8, 30d supply, fill #1

## 2024-01-08 MED ORDER — METHOCARBAMOL 500 MG PO TABS
500.0000 mg | ORAL_TABLET | ORAL | 0 refills | Status: DC
  Filled 2024-01-08: qty 100, 17d supply, fill #0

## 2024-01-15 ENCOUNTER — Other Ambulatory Visit (HOSPITAL_COMMUNITY): Payer: Self-pay

## 2024-01-15 MED ORDER — NYSTATIN 100000 UNIT/ML MT SUSP
5.0000 mL | Freq: Four times a day (QID) | OROMUCOSAL | 0 refills | Status: AC
  Filled 2024-01-15: qty 200, 10d supply, fill #0

## 2024-01-26 ENCOUNTER — Other Ambulatory Visit (HOSPITAL_COMMUNITY): Payer: Self-pay

## 2024-02-09 ENCOUNTER — Other Ambulatory Visit (HOSPITAL_COMMUNITY): Payer: Self-pay

## 2024-02-14 ENCOUNTER — Other Ambulatory Visit (HOSPITAL_COMMUNITY): Payer: Self-pay

## 2024-02-14 MED ORDER — METHOCARBAMOL 500 MG PO TABS
500.0000 mg | ORAL_TABLET | ORAL | 0 refills | Status: AC
  Filled 2024-02-14: qty 100, 17d supply, fill #0
